# Patient Record
Sex: Male | Born: 1970 | Race: Black or African American | Hispanic: No | Marital: Married | Smoking: Never smoker
Health system: Southern US, Community
[De-identification: ages and names within clinical notes are randomized; demographics above are authoritative.]

## PROBLEM LIST (undated history)

## (undated) DIAGNOSIS — E119 Type 2 diabetes mellitus without complications: Secondary | ICD-10-CM

## (undated) DIAGNOSIS — I10 Essential (primary) hypertension: Secondary | ICD-10-CM

## (undated) DIAGNOSIS — E041 Nontoxic single thyroid nodule: Secondary | ICD-10-CM

## (undated) HISTORY — DX: Essential (primary) hypertension: I10

## (undated) HISTORY — DX: Nontoxic single thyroid nodule: E04.1

## (undated) HISTORY — DX: Type 2 diabetes mellitus without complications: E11.9

## (undated) HISTORY — PX: VASECTOMY: SHX75

---

## 2002-09-26 ENCOUNTER — Ambulatory Visit (HOSPITAL_BASED_OUTPATIENT_CLINIC_OR_DEPARTMENT_OTHER): Admission: RE | Admit: 2002-09-26 | Discharge: 2002-09-26 | Payer: Self-pay | Admitting: Ophthalmology

## 2002-09-26 ENCOUNTER — Encounter (INDEPENDENT_AMBULATORY_CARE_PROVIDER_SITE_OTHER): Payer: Self-pay | Admitting: *Deleted

## 2004-05-04 ENCOUNTER — Ambulatory Visit (HOSPITAL_COMMUNITY): Admission: RE | Admit: 2004-05-04 | Discharge: 2004-05-04 | Payer: Self-pay | Admitting: Gastroenterology

## 2004-11-23 ENCOUNTER — Ambulatory Visit: Payer: Self-pay | Admitting: Family Medicine

## 2006-10-18 ENCOUNTER — Ambulatory Visit: Payer: Self-pay | Admitting: Family Medicine

## 2007-09-21 ENCOUNTER — Ambulatory Visit: Payer: Self-pay | Admitting: Family Medicine

## 2007-09-22 LAB — CONVERTED CEMR LAB
ALT: 28 units/L (ref 0–53)
AST: 38 units/L — ABNORMAL HIGH (ref 0–37)
Albumin: 4.1 g/dL (ref 3.5–5.2)
Alkaline Phosphatase: 69 units/L (ref 39–117)
BUN: 13 mg/dL (ref 6–23)
Basophils Absolute: 0 10*3/uL (ref 0.0–0.1)
Basophils Relative: 0.3 % (ref 0.0–1.0)
Bilirubin, Direct: 0.2 mg/dL (ref 0.0–0.3)
CO2: 30 meq/L (ref 19–32)
Calcium: 9.8 mg/dL (ref 8.4–10.5)
Chloride: 102 meq/L (ref 96–112)
Cholesterol: 179 mg/dL (ref 0–200)
Creatinine, Ser: 1.2 mg/dL (ref 0.4–1.5)
Eosinophils Absolute: 0.2 10*3/uL (ref 0.0–0.6)
Eosinophils Relative: 2.5 % (ref 0.0–5.0)
GFR calc Af Amer: 87 mL/min
GFR calc non Af Amer: 72 mL/min
Glucose, Bld: 109 mg/dL — ABNORMAL HIGH (ref 70–99)
HCT: 42.4 % (ref 39.0–52.0)
HDL: 34.9 mg/dL — ABNORMAL LOW (ref >39.0)
Hemoglobin: 14.9 g/dL (ref 13.0–17.0)
LDL Cholesterol: 128 mg/dL — ABNORMAL HIGH (ref 0–99)
Lymphocytes Relative: 20.8 % (ref 12.0–46.0)
MCHC: 35.1 g/dL (ref 30.0–36.0)
MCV: 88.8 fL (ref 78.0–100.0)
Monocytes Absolute: 0.5 10*3/uL (ref 0.2–0.7)
Monocytes Relative: 6.4 % (ref 3.0–11.0)
Neutro Abs: 5.9 10*3/uL (ref 1.4–7.7)
Neutrophils Relative %: 70 % (ref 43.0–77.0)
Platelets: 315 10*3/uL (ref 150–400)
Potassium: 4.3 meq/L (ref 3.5–5.1)
RBC: 4.77 M/uL (ref 4.22–5.81)
RDW: 12.1 % (ref 11.5–14.6)
Sodium: 139 meq/L (ref 135–145)
TSH: 1.41 microintl units/mL (ref 0.35–5.50)
Total Bilirubin: 1.1 mg/dL (ref 0.3–1.2)
Total CHOL/HDL Ratio: 5.1
Total Protein: 7.3 g/dL (ref 6.0–8.3)
Triglycerides: 80 mg/dL (ref 0–149)
VLDL: 16 mg/dL (ref 0–40)
WBC: 8.3 10*3/uL (ref 4.5–10.5)

## 2007-10-16 ENCOUNTER — Ambulatory Visit: Payer: Self-pay | Admitting: Family Medicine

## 2007-10-16 DIAGNOSIS — Z9189 Other specified personal risk factors, not elsewhere classified: Secondary | ICD-10-CM | POA: Insufficient documentation

## 2007-10-16 DIAGNOSIS — I10 Essential (primary) hypertension: Secondary | ICD-10-CM | POA: Insufficient documentation

## 2007-12-26 ENCOUNTER — Telehealth (INDEPENDENT_AMBULATORY_CARE_PROVIDER_SITE_OTHER): Payer: Self-pay | Admitting: *Deleted

## 2007-12-26 ENCOUNTER — Encounter: Payer: Self-pay | Admitting: Family Medicine

## 2008-01-23 ENCOUNTER — Encounter: Payer: Self-pay | Admitting: Family Medicine

## 2008-04-29 ENCOUNTER — Telehealth: Payer: Self-pay | Admitting: Family Medicine

## 2008-04-29 ENCOUNTER — Encounter: Payer: Self-pay | Admitting: Family Medicine

## 2008-05-01 ENCOUNTER — Ambulatory Visit: Payer: Self-pay | Admitting: Family Medicine

## 2008-07-18 ENCOUNTER — Encounter: Payer: Self-pay | Admitting: Family Medicine

## 2008-08-05 ENCOUNTER — Encounter: Payer: Self-pay | Admitting: Family Medicine

## 2008-08-05 HISTORY — PX: COLONOSCOPY: SHX174

## 2008-08-26 ENCOUNTER — Ambulatory Visit: Payer: Self-pay | Admitting: Family Medicine

## 2008-09-02 ENCOUNTER — Encounter: Payer: Self-pay | Admitting: Family Medicine

## 2008-09-23 ENCOUNTER — Ambulatory Visit: Payer: Self-pay | Admitting: Family Medicine

## 2008-09-23 LAB — CONVERTED CEMR LAB
Bilirubin Urine: NEGATIVE
Blood in Urine, dipstick: NEGATIVE
Glucose, Urine, Semiquant: NEGATIVE
Ketones, urine, test strip: NEGATIVE
Nitrite: NEGATIVE
Protein, U semiquant: NEGATIVE
Specific Gravity, Urine: 1.02
Urobilinogen, UA: 0.2
WBC Urine, dipstick: NEGATIVE
pH: 5.5

## 2008-09-25 LAB — CONVERTED CEMR LAB
ALT: 23 units/L (ref 0–53)
AST: 32 units/L (ref 0–37)
Albumin: 3.8 g/dL (ref 3.5–5.2)
Alkaline Phosphatase: 64 units/L (ref 39–117)
BUN: 13 mg/dL (ref 6–23)
Basophils Absolute: 0 10*3/uL (ref 0.0–0.1)
Basophils Relative: 0.1 % (ref 0.0–3.0)
Bilirubin, Direct: 0.1 mg/dL (ref 0.0–0.3)
CO2: 30 meq/L (ref 19–32)
Calcium: 9.3 mg/dL (ref 8.4–10.5)
Chloride: 110 meq/L (ref 96–112)
Cholesterol: 161 mg/dL (ref 0–200)
Creatinine, Ser: 1 mg/dL (ref 0.4–1.5)
Eosinophils Absolute: 0.3 10*3/uL (ref 0.0–0.7)
Eosinophils Relative: 4.5 % (ref 0.0–5.0)
GFR calc Af Amer: 107 mL/min
GFR calc non Af Amer: 88 mL/min
Glucose, Bld: 110 mg/dL — ABNORMAL HIGH (ref 70–99)
HCT: 42.7 % (ref 39.0–52.0)
HDL: 38.4 mg/dL — ABNORMAL LOW (ref >39.0)
Hemoglobin: 14.6 g/dL (ref 13.0–17.0)
LDL Cholesterol: 107 mg/dL — ABNORMAL HIGH (ref 0–99)
Lymphocytes Relative: 14.5 % (ref 12.0–46.0)
MCHC: 34.2 g/dL (ref 30.0–36.0)
MCV: 89.3 fL (ref 78.0–100.0)
Monocytes Absolute: 0.4 10*3/uL (ref 0.1–1.0)
Monocytes Relative: 6.1 % (ref 3.0–12.0)
Neutro Abs: 4.9 10*3/uL (ref 1.4–7.7)
Neutrophils Relative %: 74.8 % (ref 43.0–77.0)
PSA: 0.24 ng/mL (ref 0.10–4.00)
Platelets: 289 10*3/uL (ref 150–400)
Potassium: 4.5 meq/L (ref 3.5–5.1)
RBC: 4.78 M/uL (ref 4.22–5.81)
RDW: 12.2 % (ref 11.5–14.6)
Sodium: 143 meq/L (ref 135–145)
TSH: 0.79 microintl units/mL (ref 0.35–5.50)
Total Bilirubin: 1.2 mg/dL (ref 0.3–1.2)
Total CHOL/HDL Ratio: 4.2
Total Protein: 7.1 g/dL (ref 6.0–8.3)
Triglycerides: 78 mg/dL (ref 0–149)
VLDL: 16 mg/dL (ref 0–40)
WBC: 6.6 10*3/uL (ref 4.5–10.5)

## 2009-04-14 ENCOUNTER — Ambulatory Visit: Payer: Self-pay | Admitting: Family Medicine

## 2009-04-25 ENCOUNTER — Ambulatory Visit: Payer: Self-pay | Admitting: Family Medicine

## 2009-04-25 DIAGNOSIS — B029 Zoster without complications: Secondary | ICD-10-CM | POA: Insufficient documentation

## 2009-07-17 ENCOUNTER — Ambulatory Visit: Payer: Self-pay | Admitting: Family Medicine

## 2009-07-17 DIAGNOSIS — E049 Nontoxic goiter, unspecified: Secondary | ICD-10-CM | POA: Insufficient documentation

## 2009-07-18 ENCOUNTER — Telehealth: Payer: Self-pay | Admitting: Family Medicine

## 2009-07-18 LAB — CONVERTED CEMR LAB
BUN: 10 mg/dL (ref 6–23)
Basophils Absolute: 0 10*3/uL (ref 0.0–0.1)
Basophils Relative: 0.1 % (ref 0.0–3.0)
CO2: 30 meq/L (ref 19–32)
Calcium: 9.2 mg/dL (ref 8.4–10.5)
Chloride: 98 meq/L (ref 96–112)
Creatinine, Ser: 1 mg/dL (ref 0.4–1.5)
Eosinophils Absolute: 0.1 10*3/uL (ref 0.0–0.7)
Eosinophils Relative: 2 % (ref 0.0–5.0)
Free T4: 0.7 ng/dL (ref 0.6–1.6)
GFR calc non Af Amer: 87.78 mL/min (ref >60)
Glucose, Bld: 109 mg/dL — ABNORMAL HIGH (ref 70–99)
HCT: 42 % (ref 39.0–52.0)
Hemoglobin: 14.4 g/dL (ref 13.0–17.0)
Lymphocytes Relative: 20.1 % (ref 12.0–46.0)
Lymphs Abs: 1.4 10*3/uL (ref 0.7–4.0)
MCHC: 34.3 g/dL (ref 30.0–36.0)
MCV: 88.3 fL (ref 78.0–100.0)
Monocytes Absolute: 0.4 10*3/uL (ref 0.1–1.0)
Monocytes Relative: 5.9 % (ref 3.0–12.0)
Neutro Abs: 5.1 10*3/uL (ref 1.4–7.7)
Neutrophils Relative %: 71.9 % (ref 43.0–77.0)
Platelets: 300 10*3/uL (ref 150.0–400.0)
Potassium: 4 meq/L (ref 3.5–5.1)
RBC: 4.75 M/uL (ref 4.22–5.81)
RDW: 12.1 % (ref 11.5–14.6)
Sodium: 136 meq/L (ref 135–145)
T3, Free: 4 pg/mL (ref 2.3–4.2)
TSH: 1.9 microintl units/mL (ref 0.35–5.50)
WBC: 7 10*3/uL (ref 4.5–10.5)

## 2009-07-22 ENCOUNTER — Encounter: Admission: RE | Admit: 2009-07-22 | Discharge: 2009-07-22 | Payer: Self-pay | Admitting: Family Medicine

## 2009-07-30 ENCOUNTER — Encounter: Admission: RE | Admit: 2009-07-30 | Discharge: 2009-07-30 | Payer: Self-pay | Admitting: Family Medicine

## 2009-07-30 ENCOUNTER — Encounter (INDEPENDENT_AMBULATORY_CARE_PROVIDER_SITE_OTHER): Payer: Self-pay | Admitting: Interventional Radiology

## 2009-07-30 ENCOUNTER — Other Ambulatory Visit: Admission: RE | Admit: 2009-07-30 | Discharge: 2009-07-30 | Payer: Self-pay | Admitting: Interventional Radiology

## 2009-07-30 ENCOUNTER — Encounter (INDEPENDENT_AMBULATORY_CARE_PROVIDER_SITE_OTHER): Payer: Self-pay | Admitting: *Deleted

## 2009-08-08 ENCOUNTER — Telehealth (INDEPENDENT_AMBULATORY_CARE_PROVIDER_SITE_OTHER): Payer: Self-pay | Admitting: *Deleted

## 2009-10-13 ENCOUNTER — Ambulatory Visit: Payer: Self-pay | Admitting: Family Medicine

## 2009-10-15 LAB — CONVERTED CEMR LAB
ALT: 28 units/L (ref 0–53)
AST: 38 units/L — ABNORMAL HIGH (ref 0–37)
Albumin: 4.3 g/dL (ref 3.5–5.2)
Alkaline Phosphatase: 71 units/L (ref 39–117)
BUN: 9 mg/dL (ref 6–23)
Basophils Absolute: 0 10*3/uL (ref 0.0–0.1)
Basophils Relative: 0.6 % (ref 0.0–3.0)
Bilirubin, Direct: 0.1 mg/dL (ref 0.0–0.3)
CO2: 31 meq/L (ref 19–32)
Calcium: 9.8 mg/dL (ref 8.4–10.5)
Chloride: 100 meq/L (ref 96–112)
Cholesterol: 202 mg/dL — ABNORMAL HIGH (ref 0–200)
Creatinine, Ser: 1.2 mg/dL (ref 0.4–1.5)
Direct LDL: 134.2 mg/dL
Eosinophils Absolute: 0.1 10*3/uL (ref 0.0–0.7)
Eosinophils Relative: 1.4 % (ref 0.0–5.0)
GFR calc non Af Amer: 85.96 mL/min (ref >60)
Glucose, Bld: 116 mg/dL — ABNORMAL HIGH (ref 70–99)
HCT: 46.3 % (ref 39.0–52.0)
HDL: 42.5 mg/dL (ref >39.00)
Hemoglobin: 15.4 g/dL (ref 13.0–17.0)
Lymphocytes Relative: 21 % (ref 12.0–46.0)
Lymphs Abs: 1.5 10*3/uL (ref 0.7–4.0)
MCHC: 33.3 g/dL (ref 30.0–36.0)
MCV: 91.5 fL (ref 78.0–100.0)
Monocytes Absolute: 0.5 10*3/uL (ref 0.1–1.0)
Monocytes Relative: 6.4 % (ref 3.0–12.0)
Neutro Abs: 5 10*3/uL (ref 1.4–7.7)
Neutrophils Relative %: 70.6 % (ref 43.0–77.0)
PSA: 0.29 ng/mL (ref 0.10–4.00)
Platelets: 328 10*3/uL (ref 150.0–400.0)
Potassium: 4.2 meq/L (ref 3.5–5.1)
RBC: 5.06 M/uL (ref 4.22–5.81)
RDW: 12.1 % (ref 11.5–14.6)
Sodium: 140 meq/L (ref 135–145)
TSH: 1.12 microintl units/mL (ref 0.35–5.50)
Total Bilirubin: 1.2 mg/dL (ref 0.3–1.2)
Total CHOL/HDL Ratio: 5
Total Protein: 8.1 g/dL (ref 6.0–8.3)
Triglycerides: 109 mg/dL (ref 0.0–149.0)
VLDL: 21.8 mg/dL (ref 0.0–40.0)
WBC: 7.1 10*3/uL (ref 4.5–10.5)

## 2009-12-01 ENCOUNTER — Encounter: Admission: RE | Admit: 2009-12-01 | Discharge: 2009-12-01 | Payer: Self-pay | Admitting: Family Medicine

## 2009-12-15 ENCOUNTER — Encounter: Payer: Self-pay | Admitting: Family Medicine

## 2010-05-26 ENCOUNTER — Telehealth: Payer: Self-pay | Admitting: Family Medicine

## 2010-10-20 ENCOUNTER — Ambulatory Visit: Payer: Self-pay | Admitting: Family Medicine

## 2010-10-20 LAB — CONVERTED CEMR LAB
Bilirubin Urine: NEGATIVE
Blood in Urine, dipstick: NEGATIVE
Glucose, Urine, Semiquant: NEGATIVE
Ketones, urine, test strip: NEGATIVE
Nitrite: NEGATIVE
Protein, U semiquant: NEGATIVE
Specific Gravity, Urine: 1.01
Urobilinogen, UA: 0.2
WBC Urine, dipstick: NEGATIVE
pH: 6

## 2010-10-22 LAB — CONVERTED CEMR LAB
ALT: 21 units/L (ref 0–53)
AST: 38 units/L — ABNORMAL HIGH (ref 0–37)
Albumin: 4.3 g/dL (ref 3.5–5.2)
Alkaline Phosphatase: 68 units/L (ref 39–117)
BUN: 20 mg/dL (ref 6–23)
Basophils Absolute: 0 10*3/uL (ref 0.0–0.1)
Basophils Relative: 0.5 % (ref 0.0–3.0)
Bilirubin, Direct: 0.2 mg/dL (ref 0.0–0.3)
CO2: 30 meq/L (ref 19–32)
Calcium: 9.7 mg/dL (ref 8.4–10.5)
Chloride: 99 meq/L (ref 96–112)
Cholesterol: 195 mg/dL (ref 0–200)
Creatinine, Ser: 1.2 mg/dL (ref 0.4–1.5)
Eosinophils Absolute: 0.1 10*3/uL (ref 0.0–0.7)
Eosinophils Relative: 1.2 % (ref 0.0–5.0)
GFR calc non Af Amer: 83.91 mL/min (ref >60.00)
Glucose, Bld: 100 mg/dL — ABNORMAL HIGH (ref 70–99)
HCT: 43.6 % (ref 39.0–52.0)
HDL: 44.3 mg/dL (ref >39.00)
Hemoglobin: 15.1 g/dL (ref 13.0–17.0)
LDL Cholesterol: 134 mg/dL — ABNORMAL HIGH (ref 0–99)
Lymphocytes Relative: 16.4 % (ref 12.0–46.0)
Lymphs Abs: 1.7 10*3/uL (ref 0.7–4.0)
MCHC: 34.6 g/dL (ref 30.0–36.0)
MCV: 89.9 fL (ref 78.0–100.0)
Monocytes Absolute: 0.5 10*3/uL (ref 0.1–1.0)
Monocytes Relative: 5 % (ref 3.0–12.0)
Neutro Abs: 8 10*3/uL — ABNORMAL HIGH (ref 1.4–7.7)
Neutrophils Relative %: 76.9 % (ref 43.0–77.0)
PSA: 0.29 ng/mL (ref 0.10–4.00)
Platelets: 342 10*3/uL (ref 150.0–400.0)
Potassium: 4.1 meq/L (ref 3.5–5.1)
RBC: 4.84 M/uL (ref 4.22–5.81)
RDW: 12.6 % (ref 11.5–14.6)
Sodium: 138 meq/L (ref 135–145)
TSH: 1.97 microintl units/mL (ref 0.35–5.50)
Total Bilirubin: 1.4 mg/dL — ABNORMAL HIGH (ref 0.3–1.2)
Total CHOL/HDL Ratio: 4
Total Protein: 7.8 g/dL (ref 6.0–8.3)
Triglycerides: 85 mg/dL (ref 0.0–149.0)
VLDL: 17 mg/dL (ref 0.0–40.0)
WBC: 10.4 10*3/uL (ref 4.5–10.5)

## 2010-10-26 ENCOUNTER — Encounter
Admission: RE | Admit: 2010-10-26 | Discharge: 2010-10-26 | Payer: Self-pay | Source: Home / Self Care | Attending: Family Medicine | Admitting: Family Medicine

## 2010-12-08 NOTE — Progress Notes (Signed)
Summary: EXPLANATION OF NO SHOW   Phone Note Call from Patient Call back at Home Phone [phone]   Caller: PT LIVE Call For: FRY  Summary of Call: PATIENT CALLED STATES HE HAD AN EMERGENCY AND HAD TO GO OUT OF TOWN.  HE JUST RETURNED TO [place].   Initial call taken by: Roselle Locus,  April 29, 2008 3:13 PM  Follow-up for Phone Call        ok, reschedule his visit Follow-up by: Nelwyn Salisbury MD,  April 29, 2008 5:26 PM

## 2010-12-08 NOTE — Progress Notes (Signed)
  Phone Note Call from Patient   Caller: Patient Summary of Call: here with his son asking about when to look at his thyroid again. We wanted to do a 6 month follow up, and the last one was in January.  Initial call taken by: Nelwyn Salisbury MD,  May 26, 2010 4:33 PM  Follow-up for Phone Call        set up another thyroid US soon to follow a nodule (was seen at [facility])  Follow-up by: Nelwyn Salisbury MD,  May 26, 2010 4:33 PM

## 2010-12-08 NOTE — Letter (Signed)
Summary: Generic Letter  [place] [facility]  [address] [place], [place] [address]   Phone: [phone]  Fax: [phone]    12/15/2009  Craig Nguyen [address] [place], [place]  [address]  Dear [name],  I have been trying to reach you by phone to discuss your ultrasound results.  Your ultrasound shows the large nodule on the right side of the thyroid to be unchanged from before, so no intervention is needed. We suggest a follow up thyroid US again in 6 months.  Please call Arline Asp if you have any questions at [phone] ext [phone].  Thanks for your time.  Sincerely,   Gershon Crane, MD

## 2010-12-08 NOTE — Assessment & Plan Note (Signed)
Summary: error      Current Allergies: ! ASA        Complete Medication List: 1)  Diovan Hct 160-25 Mg Tabs (Valsartan-hydrochlorothiazide) .Marland Kitchen.. 1 by mouth once daily    ]

## 2010-12-08 NOTE — Assessment & Plan Note (Signed)
Summary: F UP FOR MEDS //DB   Vital Signs:  Patient Profile:   40 Years Old Male Height:     71.5 inches Weight:      225 pounds Temp:     98.4 degrees F oral Pulse rate:   84 / minute BP sitting:   130 / 82  (left arm) Cuff size:   large  Vitals Entered By: Alfred Levins, CMA (August 26, 2008 8:48 AM)                hh  Chief Complaint:  bp check and renew med.  History of Present Illness: Here to recheck BP. Feels good, exercising.    Current Allergies: ! ASA  Past Medical History:    Reviewed history from 10/16/2007 and no changes required:       Hypertension  Past Surgical History:    Reviewed history from 10/16/2007 and no changes required:       colonoscopy 08-05-08 per Dr. Loreta Ave, repeat at age 44       Vasectomy     Review of Systems  The patient denies anorexia, fever, weight loss, weight gain, vision loss, decreased hearing, hoarseness, chest pain, syncope, dyspnea on exertion, peripheral edema, prolonged cough, headaches, hemoptysis, abdominal pain, melena, hematochezia, severe indigestion/heartburn, hematuria, incontinence, genital sores, muscle weakness, suspicious skin lesions, transient blindness, difficulty walking, depression, unusual weight change, abnormal bleeding, enlarged lymph nodes, angioedema, breast masses, and testicular masses.     Physical Exam  General:     Well-developed,well-nourished,in no acute distress; alert,appropriate and cooperative throughout examination Neck:     No deformities, masses, or tenderness noted. Lungs:     Normal respiratory effort, chest expands symmetrically. Lungs are clear to auscultation, no crackles or wheezes. Heart:     Normal rate and regular rhythm. S1 and S2 normal without gallop, murmur, click, rub or other extra sounds.    Impression & Recommendations:  Problem # 1:  HYPERTENSION (ICD-401.9)  His updated medication list for this problem includes:    Diovan Hct 160-25 Mg Tabs  (Valsartan-hydrochlorothiazide) .Marland Kitchen... 1 by mouth once daily  Flu Vaccine Consent Questions     Do you have a history of severe allergic reactions to this vaccine? no    Any prior history of allergic reactions to egg and/or gelatin? no    Do you have a sensitivity to the preservative Thimersol? no    Do you have a past history of Guillan-Barre Syndrome? no    Do you currently have an acute febrile illness? no    Have you ever had a severe reaction to latex? no    Vaccine information given and explained to patient? yes    Are you currently pregnant? no    Lot Number:[id number]   Site Given  Left Deltoid IM  Complete Medication List: 1)  Diovan Hct 160-25 Mg Tabs (Valsartan-hydrochlorothiazide) .Marland Kitchen.. 1 by mouth once daily  Other Orders: Admin 1st Vaccine ([address]) Flu Vaccine 26yrs + ([address])   Patient Instructions: 1)  Please schedule a follow-up appointment in 6 months.   Prescriptions: DIOVAN HCT 160-25 MG  TABS (VALSARTAN-HYDROCHLOROTHIAZIDE) 1 by mouth once daily  #30 x 11   Entered and Authorized by:   Nelwyn Salisbury MD   Signed by:   Nelwyn Salisbury MD on 08/26/2008   Method used:   [facility]  [address] [id number]       [address]  [place], [place]  [address]       Ph: [phone] or [phone]       Fax: [phone]   RxID:   [id number]  ]

## 2010-12-08 NOTE — Consult Note (Signed)
Summary: Dr Loreta Ave note  Dr Loreta Ave note   Imported By: Kassie Mends 07/31/2008 13:47:51  _____________________________________________________________________  External Attachment:    Type:   Image     Comment:   Dr Loreta Ave note

## 2010-12-08 NOTE — Consult Note (Signed)
Summary: Dr Loreta Ave note  Dr Loreta Ave note   Imported By: Kassie Mends 01/01/2008 08:24:07  _____________________________________________________________________  External Attachment:    Type:   Image     Comment:   Dr Loreta Ave note

## 2010-12-08 NOTE — Progress Notes (Signed)
Summary: requesting results  Phone Note Call from Patient   Summary of Call: Patient requesting results of thyroid biopsy.  Patient can be reached at [phone]. Initial call taken by: Darra Lis RMA,  August 08, 2009 9:58 AM  Follow-up for Phone Call        no results yet Follow-up by: Nelwyn Salisbury MD,  August 08, 2009 11:07 AM  Additional Follow-up for Phone Call Additional follow up Details #1::        Patient aware-Patient will check back with [place] in a couple of days. Additional Follow-up by: Darra Lis RMA,  August 08, 2009 11:21 AM

## 2010-12-08 NOTE — Assessment & Plan Note (Signed)
Summary: FOLLOW UP/MHF   Vital Signs:  Patient Profile:   40 Years Old Male Height:     71.5 inches Weight:      227 pounds Temp:     98.1 degrees F oral Pulse rate:   94 / minute Pulse rhythm:   regular BP sitting:   110 / 82  (left arm) Cuff size:   large  Vitals Entered By: Alfred Levins, CMA (May 01, 2008 8:52 AM)                 Chief Complaint:  f/u.  History of Present Illness: Here to recheck BP. He feels great, is exercising, has lost a little weight.     Current Allergies: ! ASA  Past Medical History:    Reviewed history from 10/16/2007 and no changes required:       Hypertension     Review of Systems  The patient denies anorexia, fever, weight loss, weight gain, vision loss, decreased hearing, hoarseness, chest pain, syncope, dyspnea on exertion, peripheral edema, prolonged cough, headaches, hemoptysis, abdominal pain, melena, hematochezia, severe indigestion/heartburn, hematuria, incontinence, genital sores, muscle weakness, suspicious skin lesions, transient blindness, difficulty walking, depression, unusual weight change, abnormal bleeding, enlarged lymph nodes, angioedema, breast masses, and testicular masses.     Physical Exam  General:     Well-developed,well-nourished,in no acute distress; alert,appropriate and cooperative throughout examination Lungs:     Normal respiratory effort, chest expands symmetrically. Lungs are clear to auscultation, no crackles or wheezes. Heart:     Normal rate and regular rhythm. S1 and S2 normal without gallop, murmur, click, rub or other extra sounds.    Impression & Recommendations:  Problem # 1:  HYPERTENSION (ICD-401.9)  His updated medication list for this problem includes:    Diovan Hct 160-25 Mg Tabs (Valsartan-hydrochlorothiazide) .Marland Kitchen... 1 by mouth once daily   Complete Medication List: 1)  Diovan Hct 160-25 Mg Tabs (Valsartan-hydrochlorothiazide) .Marland Kitchen.. 1 by mouth once daily   Patient  Instructions: 1)  Please schedule a follow-up appointment in 6 months.   ]

## 2010-12-08 NOTE — Consult Note (Signed)
Summary: Dr Loreta Ave note  Dr Loreta Ave note   Imported By: Kassie Mends 02/21/2008 16:22:47  _____________________________________________________________________  External Attachment:    Type:   Image     Comment:   Dr Loreta Ave note

## 2010-12-08 NOTE — Procedures (Signed)
Summary: Colonoscopy Report/[facility]  Colonoscopy Report/[facility]   Imported By: Maryln Gottron 08/08/2008 14:57:45  _____________________________________________________________________  External Attachment:    Type:   Image     Comment:   External Document

## 2010-12-08 NOTE — Assessment & Plan Note (Signed)
Summary: Left arm pain/rash/LC   Vital Signs:  Patient profile:   40 year old male Weight:      229 pounds Temp:     97.8 degrees F oral Pulse rate:   82 / minute BP sitting:   122 / 84  (left arm) Cuff size:   large  Vitals Entered By: Alfred Levins, CMA (April 25, 2009 10:29 AM) CC: lt arm pain with rash   History of Present Illness: Here with burning pains in the left shoulder and left upper arm for 3 days, then onset last night of red bumps in these areas.   Allergies: 1)  ! Jonne Ply  Past History:  Past Medical History: Reviewed history from 10/16/2007 and no changes required. Hypertension  Review of Systems  The patient denies anorexia, fever, weight loss, weight gain, vision loss, decreased hearing, hoarseness, chest pain, syncope, dyspnea on exertion, peripheral edema, prolonged cough, headaches, hemoptysis, abdominal pain, melena, hematochezia, severe indigestion/heartburn, hematuria, incontinence, genital sores, muscle weakness, suspicious skin lesions, transient blindness, difficulty walking, depression, unusual weight change, abnormal bleeding, enlarged lymph nodes, angioedema, breast masses, and testicular masses.    Physical Exam  General:  Well-developed,well-nourished,in no acute distress; alert,appropriate and cooperative throughout examination Neurologic:  No cranial nerve deficits noted. Station and gait are normal. Plantar reflexes are down-going bilaterally. DTRs are symmetrical throughout. Sensory, motor and coordinative functions appear intact. Skin:  scattered red vessicles over left upper arm, left chest and shoulder, and lefgt upper back   Impression & Recommendations:  Problem # 1:  HERPES ZOSTER (ICD-053.9)  Complete Medication List: 1)  Diovan Hct 160-25 Mg Tabs (Valsartan-hydrochlorothiazide) .Marland Kitchen.. 1 by mouth once daily 2)  Prednisone (pak) 10 Mg Tabs (Prednisone) .... As directed for 12 days 3)  Valtrex 1 Gm Tabs (Valacyclovir hcl) .... Three times  a day  Patient Instructions: 1)  Please schedule a follow-up appointment as needed .  Prescriptions: VALTREX 1 GM TABS (VALACYCLOVIR HCL) three times a day  #21 x 0   Entered and Authorized by:   Nelwyn Salisbury MD   Signed by:   Nelwyn Salisbury MD on 04/25/2009   Method used:   [facility]  [address] [id number]       [address]       [place], [place]  [address]       Ph: [phone] or [phone]       Fax: [phone]   RxID:   [id number] PREDNISONE (PAK) 10 MG TABS (PREDNISONE) as directed for 12 days  #1 x 0   Entered and Authorized by:   Nelwyn Salisbury MD   Signed by:   Nelwyn Salisbury MD on 04/25/2009   Method used:   [facility]        [facility]  [address] [id number]       [address]       [place], [place]  [address]       Ph: [phone] or [phone]       Fax: [phone]   RxID:   [id number]

## 2010-12-08 NOTE — Assessment & Plan Note (Signed)
Summary: 6 MONTH ROV/NJR/PT RESCD//CCM   Vital Signs:  Patient profile:   40 year old male Weight:      232 pounds BMI:     32.02 Temp:     97.5 degrees F oral Pulse rate:   88 / minute BP sitting:   114 / 72  (left arm) Cuff size:   large  Vitals Entered By: Alfred Levins, CMA (April 14, 2009 8:56 AM) CC: 6 mth f/u, fasting   History of Present Illness: Feels great. watches his diet and exercises several days a week.   Allergies: 1)  ! Jonne Ply  Past History:  Past Medical History: Reviewed history from 10/16/2007 and no changes required. Hypertension  Review of Systems  The patient denies anorexia, fever, weight loss, weight gain, vision loss, decreased hearing, hoarseness, chest pain, syncope, dyspnea on exertion, peripheral edema, prolonged cough, headaches, hemoptysis, abdominal pain, melena, hematochezia, severe indigestion/heartburn, hematuria, incontinence, genital sores, muscle weakness, suspicious skin lesions, transient blindness, difficulty walking, depression, unusual weight change, abnormal bleeding, enlarged lymph nodes, angioedema, breast masses, and testicular masses.    Physical Exam  General:  Well-developed,well-nourished,in no acute distress; alert,appropriate and cooperative throughout examination Neck:  No deformities, masses, or tenderness noted. Lungs:  Normal respiratory effort, chest expands symmetrically. Lungs are clear to auscultation, no crackles or wheezes. Heart:  Normal rate and regular rhythm. S1 and S2 normal without gallop, murmur, click, rub or other extra sounds. Extremities:  no edema   Impression & Recommendations:  Problem # 1:  HYPERTENSION (ICD-401.9)  His updated medication list for this problem includes:    Diovan Hct 160-25 Mg Tabs (Valsartan-hydrochlorothiazide) .Marland Kitchen... 1 by mouth once daily  Complete Medication List: 1)  Diovan Hct 160-25 Mg Tabs (Valsartan-hydrochlorothiazide) .Marland Kitchen.. 1 by mouth once daily  Patient  Instructions: 1)  recheck with cpx and labs in 6 months

## 2010-12-08 NOTE — Letter (Signed)
Summary: [facility]   Imported By: Maryln Gottron 09/18/2008 14:19:30  _____________________________________________________________________  External Attachment:    Type:   Image     Comment:   External Document

## 2010-12-08 NOTE — Assessment & Plan Note (Signed)
Summary: CPX/MHF   Vital Signs:  Patient Profile:   40 Years Old Male Height:     71.5 inches Weight:      228 pounds Temp:     98.6 degrees F oral Pulse rate:   76 / minute Pulse rhythm:   regular BP sitting:   128 / 82  (left arm) Cuff size:   large  Vitals Entered By: Alfred Levins, CMA (October 16, 2007 10:23 AM)                 Chief Complaint:  cpx.  History of Present Illness: 40 yr old male for cpx. Feels fine. BP is stable.  Current Allergies: ! ASA  Past Medical History:    Reviewed history and no changes required:       Hypertension  Past Surgical History:    Reviewed history and no changes required:       colonoscopy 2005, hemmorrhoids only       Vasectomy   Family History:    Reviewed history and no changes required:       Family History Diabetes 1st degree relative       Family History Hypertension       Family History of Prostate CA 1st degree relative <50  Social History:    Reviewed history and no changes required:       Married       Never Smoked       Alcohol use-no       Drug use-no   Risk Factors:  Tobacco use:  never Drug use:  no Alcohol use:  no   Review of Systems  The patient denies anorexia, fever, weight loss, vision loss, decreased hearing, hoarseness, chest pain, syncope, dyspnea on exhertion, peripheral edema, prolonged cough, hemoptysis, abdominal pain, melena, hematochezia, severe indigestion/heartburn, hematuria, incontinence, genital sores, muscle weakness, suspicious skin lesions, transient blindness, difficulty walking, depression, unusual weight change, abnormal bleeding, enlarged lymph nodes, angioedema, breast masses, and testicular masses.     Physical Exam  General:     Well-developed,well-nourished,in no acute distress; alert,appropriate and cooperative throughout examination Head:     Normocephalic and atraumatic without obvious abnormalities. No apparent alopecia or balding. Eyes:     No corneal or  conjunctival inflammation noted. EOMI. Perrla. Funduscopic exam benign, without hemorrhages, exudates or papilledema. Vision grossly normal. Ears:     External ear exam shows no significant lesions or deformities.  Otoscopic examination reveals clear canals, tympanic membranes are intact bilaterally without bulging, retraction, inflammation or discharge. Hearing is grossly normal bilaterally. Nose:     External nasal examination shows no deformity or inflammation. Nasal mucosa are pink and moist without lesions or exudates. Mouth:     Oral mucosa and oropharynx without lesions or exudates.  Teeth in good repair. Neck:     No deformities, masses, or tenderness noted. Chest Wall:     No deformities, masses, tenderness or gynecomastia noted. Lungs:     Normal respiratory effort, chest expands symmetrically. Lungs are clear to auscultation, no crackles or wheezes. Heart:     Normal rate and regular rhythm. S1 and S2 normal without gallop, murmur, click, rub or other extra sounds. Abdomen:     Bowel sounds positive,abdomen soft and non-tender without masses, organomegaly or hernias noted. Genitalia:     Testes bilaterally descended without nodularity, tenderness or masses. No scrotal masses or lesions. No penis lesions or urethral discharge. Msk:     No deformity or scoliosis noted of  thoracic or lumbar spine.   Pulses:     R and L carotid,radial,femoral,dorsalis pedis and posterior tibial pulses are full and equal bilaterally Extremities:     No clubbing, cyanosis, edema, or deformity noted with normal full range of motion of all joints.   Neurologic:     No cranial nerve deficits noted. Station and gait are normal. Plantar reflexes are down-going bilaterally. DTRs are symmetrical throughout. Sensory, motor and coordinative functions appear intact. Skin:     Intact without suspicious lesions or rashes Cervical Nodes:     No lymphadenopathy noted Axillary Nodes:     No palpable  lymphadenopathy Inguinal Nodes:     No significant adenopathy Psych:     Cognition and judgment appear intact. Alert and cooperative with normal attention span and concentration. No apparent delusions, illusions, hallucinations    Impression & Recommendations:  Problem # 1:  WELL ADULT EXAM (ICD-V70.0)  lot [id number], EXP 30 jun 09, sanofi pasteur left deltoid IM, 0.5 cc.  Complete Medication List: 1)  Diovan Hct 160-25 Mg Tabs (Valsartan-hydrochlorothiazide) .Marland Kitchen.. 1 by mouth once daily  Other Orders: Influenza Vaccine NON MCR ([address])   Patient Instructions: 1)  Please schedule a follow-up appointment in 6 months.    Prescriptions: DIOVAN HCT 160-25 MG  TABS (VALSARTAN-HYDROCHLOROTHIAZIDE) 1 by mouth once daily  #30 x 11   Entered and Authorized by:   Nelwyn Salisbury MD   Signed by:   Nelwyn Salisbury MD on 10/16/2007   Method used:   [facility] ...       [facility]  [address] [id number]       [address]       [place], [place]  [address]       Ph: [phone] or [phone]       Fax: [phone]   RxID:   [id number]  ]  Influenza Vaccine    Vaccine Type: Fluvax Non-MCR    Given by: Alfred Levins, CMA  Flu Vaccine Consent Questions    Do you have a history of severe allergic reactions to this vaccine? no    Any prior history of allergic reactions to egg and/or gelatin? no    Do you have a sensitivity to the preservative Thimersol? no    Do you have a past history of Guillan-Barre Syndrome? no    Do you currently have an acute febrile illness? no    Have you ever had a severe reaction to latex? no    Vaccine information given and explained to patient? yes

## 2010-12-10 NOTE — Assessment & Plan Note (Signed)
Summary: CPX/PT COMING IN FASTING/CJR rsc bmp/njr   Vital Signs:  Patient profile:   40 year old male Height:      72 inches Weight:      228 pounds BMI:     31.03 Temp:     98.4 degrees F oral Pulse rate:   62 / minute Pulse rhythm:   regular Resp:     12 per minute BP sitting:   110 / 70  Vitals Entered By: Lynann Beaver CMA AAMA (October 20, 2010 8:47 AM) CC: cpx Is Patient Diabetic? No Pain Assessment Patient in pain? no        CC:  cpx.  History of Present Illness: 40 yr old male for a cpx. He feels fine and has no complaints. He jogs 4 days a week.   Current Medications (verified): 1)  Diovan Hct 160-25 Mg  Tabs (Valsartan-Hydrochlorothiazide) .Marland Kitchen.. 1 By Mouth Once Daily  Allergies (verified): 1)  ! Asa  Past History:  Past Medical History: Hypertension right thyroid lobe nodule, benign  Past Surgical History: Reviewed history from 10/13/2009 and no changes required. colonoscopy 08-05-08 per Dr. Loreta Ave, repeat at age 31 Vasectomy fine needle biopsy of right thyroid lobe nodule 07-30-09  Family History: Reviewed history from 10/16/2007 and no changes required. Family History Diabetes 1st degree relative Family History Hypertension Family History of Prostate CA 1st degree relative <50 sister died at 81 of pulmonary sarcoidosis  Social History: Reviewed history from 10/16/2007 and no changes required. Married Never Smoked Alcohol use-no Drug use-no  Review of Systems  The patient denies anorexia, fever, weight loss, weight gain, vision loss, decreased hearing, hoarseness, chest pain, syncope, dyspnea on exertion, peripheral edema, prolonged cough, headaches, hemoptysis, abdominal pain, melena, hematochezia, severe indigestion/heartburn, hematuria, incontinence, genital sores, muscle weakness, suspicious skin lesions, transient blindness, difficulty walking, depression, unusual weight change, abnormal bleeding, enlarged lymph nodes, angioedema, breast  masses, and testicular masses.    Physical Exam  General:  Well-developed,well-nourished,in no acute distress; alert,appropriate and cooperative throughout examination Head:  Normocephalic and atraumatic without obvious abnormalities. No apparent alopecia or balding. Eyes:  No corneal or conjunctival inflammation noted. EOMI. Perrla. Funduscopic exam benign, without hemorrhages, exudates or papilledema. Vision grossly normal. Ears:  External ear exam shows no significant lesions or deformities.  Otoscopic examination reveals clear canals, tympanic membranes are intact bilaterally without bulging, retraction, inflammation or discharge. Hearing is grossly normal bilaterally. Nose:  External nasal examination shows no deformity or inflammation. Nasal mucosa are pink and moist without lesions or exudates. Mouth:  Oral mucosa and oropharynx without lesions or exudates.  Teeth in good repair. Neck:  the right lobe of the thyroid is enlarged but smooth, not tender  Chest Wall:  No deformities, masses, tenderness or gynecomastia noted. Lungs:  Normal respiratory effort, chest expands symmetrically. Lungs are clear to auscultation, no crackles or wheezes. Heart:  Normal rate and regular rhythm. S1 and S2 normal without gallop, murmur, click, rub or other extra sounds. Abdomen:  Bowel sounds positive,abdomen soft and non-tender without masses, organomegaly or hernias noted. Rectal:  No external abnormalities noted. Normal sphincter tone. No rectal masses or tenderness. Genitalia:  Testes bilaterally descended without nodularity, tenderness or masses. No scrotal masses or lesions. No penis lesions or urethral discharge. Prostate:  Prostate gland firm and smooth, no enlargement, nodularity, tenderness, mass, asymmetry or induration. Msk:  No deformity or scoliosis noted of thoracic or lumbar spine.   Pulses:  R and L carotid,radial,femoral,dorsalis pedis and posterior tibial pulses are  full and equal  bilaterally Extremities:  No clubbing, cyanosis, edema, or deformity noted with normal full range of motion of all joints.   Neurologic:  No cranial nerve deficits noted. Station and gait are normal. Plantar reflexes are down-going bilaterally. DTRs are symmetrical throughout. Sensory, motor and coordinative functions appear intact. Skin:  Intact without suspicious lesions or rashes Cervical Nodes:  No lymphadenopathy noted Axillary Nodes:  No palpable lymphadenopathy Inguinal Nodes:  No significant adenopathy Psych:  Cognition and judgment appear intact. Alert and cooperative with normal attention span and concentration. No apparent delusions, illusions, hallucinations   Impression & Recommendations:  Problem # 1:  WELL ADULT EXAM (ICD-V70.0)  Orders: UA Dipstick w/o Micro (automated)  (81003) Venipuncture (04540) TLB-Lipid Panel (80061-LIPID) TLB-BMP (Basic Metabolic Panel-BMET) (80048-METABOL) TLB-CBC Platelet - w/Differential (85025-CBCD) TLB-Hepatic/Liver Function Pnl (80076-HEPATIC) TLB-TSH (Thyroid Stimulating Hormone) (84443-TSH) TLB-PSA (Prostate Specific Antigen) (84153-PSA) Specimen Handling (98119)  Problem # 2:  THYROMEGALY (ICD-240.9)  Orders: Radiology Referral (Radiology)  Complete Medication List: 1)  Diovan Hct 160-25 Mg Tabs (Valsartan-hydrochlorothiazide) .Marland Kitchen.. 1 by mouth once daily  Patient Instructions: 1)  Get labs today. Set up a thyroid US soon.  Prescriptions: DIOVAN HCT 160-25 MG  TABS (VALSARTAN-HYDROCHLOROTHIAZIDE) 1 by mouth once daily  #90 x 3   Entered and Authorized by:   Nelwyn Salisbury MD   Signed by:   Nelwyn Salisbury MD on 10/20/2010   Method used:   Print then Give to Patient   RxID:   [id number]    Orders Added: 1)  Est. Patient 40-64 years [99396] 2)  UA Dipstick w/o Micro (automated)  [81003] 3)  Venipuncture [84696] 4)  TLB-Lipid Panel [80061-LIPID] 5)  TLB-BMP (Basic Metabolic Panel-BMET) [80048-METABOL] 6)  TLB-CBC  Platelet - w/Differential [85025-CBCD] 7)  TLB-Hepatic/Liver Function Pnl [80076-HEPATIC] 8)  TLB-TSH (Thyroid Stimulating Hormone) [84443-TSH] 9)  TLB-PSA (Prostate Specific Antigen) [29528-UXL] 10)  Specimen Handling [99000] 11)  Radiology Referral [Radiology]    Laboratory Results   Urine Tests    Routine Urinalysis   Color: yellow Appearance: Clear Glucose: negative   (Normal Range: Negative) Bilirubin: negative   (Normal Range: Negative) Ketone: negative   (Normal Range: Negative) Spec. Gravity: 1.010   (Normal Range: 1.003-1.035) Blood: negative   (Normal Range: Negative) pH: 6.0   (Normal Range: 5.0-8.0) Protein: negative   (Normal Range: Negative) Urobilinogen: 0.2   (Normal Range: 0-1) Nitrite: negative   (Normal Range: Negative) Leukocyte Esterace: negative   (Normal Range: Negative)    Comments: Rita Ohara  October 20, 2010 11:02 AM

## 2011-03-26 NOTE — Op Note (Signed)
[name], Craig Nguyen                           ACCOUNT NO.:  [id number]   MEDICAL RECORD NO.:  [id number]                   PATIENT TYPE:  AMB   LOCATION:  ENDO                                 FACILITY:  [facility]   PHYSICIAN:  Anselmo Rod, M.D.               DATE OF BIRTH:  May 11, 1971   DATE OF PROCEDURE:  05/04/2004  DATE OF DISCHARGE:                                 OPERATIVE REPORT   PROCEDURE:  Colonoscopy.   ENDOSCOPIST:  Charna Elizabeth, M.D.   INSTRUMENT USED:  Olympus video colonoscope.   INDICATIONS FOR PROCEDURE:  Rectal bleeding in a 40 year old African  American male, rule out colonic polyps, masses, etc.   PREPROCEDURE PREPARATION:  Informed consent was obtained from the patient.  The patient was fasted for eight hours prior to the procedure and prepped  with a bottle of magnesium citrate and a gallon of GoLYTELY the night prior  to the procedure.   PREPROCEDURE PHYSICAL:  Patient with stable vital signs.  Neck supple.  Chest clear to auscultation.  S1 and S2 regular.  Abdomen soft with normal  bowel sounds.   DESCRIPTION OF PROCEDURE:  The patient was placed in the left lateral  decubitus position, sedated with 100 mg of Demerol and 10 mg Versed in slow  incremental doses.  Once the patient was adequately sedated, maintained on  low flow oxygen and continuous cardiac monitoring, the Olympus video  colonoscope was advanced from the rectum to the cecum.  The appendiceal  orifice and ileocecal valve were clearly visualized and photographed.  Isolated diverticulum was seen in the left colon.  Small internal  hemorrhoids were appreciated on retroflexion of the rectum.  The patient  tolerated the procedure well without complications.   IMPRESSION:  1. Small nonbleeding internal hemorrhoids.  2. 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  3. Unrevealing colonoscopy.   RECOMMENDATIONS:  1. Repeat colonoscopy has been recommended at age 46 unless the patient     develops  any problems in the interim.  2. Continue high fiber diet with liberal fluid intake.  3. Stool softeners as needed.  4. Outpatient follow up in the next two weeks or earlier if need be.                                               Anselmo Rod, M.D.    JNM/MEDQ  D:  05/04/2004  T:  05/04/2004  Job:  [id number]   cc:   Gabriel Earing, M.D.  [address]  [place]  [place] [address]  Fax: [phone]

## 2012-08-16 ENCOUNTER — Ambulatory Visit (INDEPENDENT_AMBULATORY_CARE_PROVIDER_SITE_OTHER): Payer: BC Managed Care – PPO | Admitting: Family Medicine

## 2012-08-16 ENCOUNTER — Encounter: Payer: Self-pay | Admitting: Family Medicine

## 2012-08-16 VITALS — BP 112/68 | HR 77 | Temp 98.7°F | Ht 71.75 in | Wt 229.0 lb

## 2012-08-16 DIAGNOSIS — E041 Nontoxic single thyroid nodule: Secondary | ICD-10-CM

## 2012-08-16 DIAGNOSIS — Z23 Encounter for immunization: Secondary | ICD-10-CM

## 2012-08-16 DIAGNOSIS — Z0000 Encounter for general adult medical examination without abnormal findings: Secondary | ICD-10-CM

## 2012-08-16 LAB — CBC WITH DIFFERENTIAL/PLATELET
Basophils Absolute: 0 10*3/uL (ref 0.0–0.1)
Basophils Relative: 0.5 % (ref 0.0–3.0)
Eosinophils Absolute: 0.2 10*3/uL (ref 0.0–0.7)
Eosinophils Relative: 2.3 % (ref 0.0–5.0)
HCT: 44.5 % (ref 39.0–52.0)
Hemoglobin: 14.6 g/dL (ref 13.0–17.0)
Lymphocytes Relative: 15.6 % (ref 12.0–46.0)
Lymphs Abs: 1.4 10*3/uL (ref 0.7–4.0)
MCHC: 32.7 g/dL (ref 30.0–36.0)
MCV: 90.6 fl (ref 78.0–100.0)
Monocytes Absolute: 0.5 10*3/uL (ref 0.1–1.0)
Monocytes Relative: 5.5 % (ref 3.0–12.0)
Neutro Abs: 6.6 10*3/uL (ref 1.4–7.7)
Neutrophils Relative %: 76.1 % (ref 43.0–77.0)
Platelets: 346 10*3/uL (ref 150.0–400.0)
RBC: 4.92 Mil/uL (ref 4.22–5.81)
RDW: 12.8 % (ref 11.5–14.6)
WBC: 8.7 10*3/uL (ref 4.5–10.5)

## 2012-08-16 LAB — BASIC METABOLIC PANEL
BUN: 14 mg/dL (ref 6–23)
CO2: 30 mEq/L (ref 19–32)
Calcium: 9.8 mg/dL (ref 8.4–10.5)
Chloride: 101 mEq/L (ref 96–112)
Creatinine, Ser: 1.1 mg/dL (ref 0.4–1.5)
GFR: 89.95 mL/min (ref >60.00)
Glucose, Bld: 101 mg/dL — ABNORMAL HIGH (ref 70–99)
Potassium: 4.1 mEq/L (ref 3.5–5.1)
Sodium: 138 mEq/L (ref 135–145)

## 2012-08-16 LAB — PSA: PSA: 0.2 ng/mL (ref 0.10–4.00)

## 2012-08-16 LAB — LIPID PANEL
Cholesterol: 182 mg/dL (ref 0–200)
HDL: 43.9 mg/dL (ref >39.00)
LDL Cholesterol: 121 mg/dL — ABNORMAL HIGH (ref 0–99)
Total CHOL/HDL Ratio: 4
Triglycerides: 88 mg/dL (ref 0.0–149.0)
VLDL: 17.6 mg/dL (ref 0.0–40.0)

## 2012-08-16 LAB — POCT URINALYSIS DIPSTICK
Bilirubin, UA: NEGATIVE
Blood, UA: NEGATIVE
Glucose, UA: NEGATIVE
Ketones, UA: NEGATIVE
Leukocytes, UA: NEGATIVE
Nitrite, UA: NEGATIVE
Protein, UA: NEGATIVE
Spec Grav, UA: 1.01
Urobilinogen, UA: 0.2
pH, UA: 6.5

## 2012-08-16 LAB — HEPATIC FUNCTION PANEL
ALT: 19 U/L (ref 0–53)
AST: 31 U/L (ref 0–37)
Albumin: 4.1 g/dL (ref 3.5–5.2)
Alkaline Phosphatase: 63 U/L (ref 39–117)
Bilirubin, Direct: 0.2 mg/dL (ref 0.0–0.3)
Total Bilirubin: 1.4 mg/dL — ABNORMAL HIGH (ref 0.3–1.2)
Total Protein: 7.6 g/dL (ref 6.0–8.3)

## 2012-08-16 LAB — TSH: TSH: 1.5 u[IU]/mL (ref 0.35–5.50)

## 2012-08-16 MED ORDER — VALSARTAN-HYDROCHLOROTHIAZIDE 160-25 MG PO TABS: 1.0000 | ORAL_TABLET | Freq: Every day | ORAL | Status: DC

## 2012-08-16 NOTE — Progress Notes (Signed)
  Subjective:    Patient ID: Craig Nguyen, male    DOB: 12/13/1970, 41 y.o.   MRN: [id number]  HPI 41 yr old male for a cpx. He feels fine. The nodule on is thyroid seems stable and has not changed at all. His last [place] was done in December 2011, now almost 2 years ago.    Review of Systems  Constitutional: Negative.   HENT: Negative.   Eyes: Negative.   Respiratory: Negative.   Cardiovascular: Negative.   Gastrointestinal: Negative.   Genitourinary: Negative.   Musculoskeletal: Negative.   Skin: Negative.   Neurological: Negative.   Hematological: Negative.   Psychiatric/Behavioral: Negative.        Objective:   Physical Exam  Constitutional: He is oriented to person, place, and time. He appears well-developed and well-nourished. No distress.  HENT:  Head: Normocephalic and atraumatic.  Right Ear: External ear normal.  Left Ear: External ear normal.  Nose: Nose normal.  Mouth/Throat: Oropharynx is clear and moist. No oropharyngeal exudate.  Eyes: Conjunctivae normal and EOM are normal. Pupils are equal, round, and reactive to light. Right eye exhibits no discharge. Left eye exhibits no discharge. No scleral icterus.  Neck: Neck supple. No JVD present. No tracheal deviation present. Thyromegaly present.       Fullness in the right thyroid lobe, not tender   Cardiovascular: Normal rate, regular rhythm, normal heart sounds and intact distal pulses.  Exam reveals no gallop and no friction rub.   No murmur heard. Pulmonary/Chest: Effort normal and breath sounds normal. No respiratory distress. He has no wheezes. He has no rales. He exhibits no tenderness.  Abdominal: Soft. Bowel sounds are normal. He exhibits no distension and no mass. There is no tenderness. There is no rebound and no guarding.  Genitourinary: Rectum normal, prostate normal and penis normal. Guaiac negative stool. No penile tenderness.  Musculoskeletal: Normal range of motion. He exhibits no edema and no  tenderness.  Lymphadenopathy:    He has no cervical adenopathy.  Neurological: He is alert and oriented to person, place, and time. He has normal reflexes. No cranial nerve deficit. He exhibits normal muscle tone. Coordination normal.  Skin: Skin is warm and dry. No rash noted. He is not diaphoretic. No erythema. No pallor.  Psychiatric: He has a normal mood and affect. His behavior is normal. Judgment and thought content normal.          Assessment & Plan:  Well exam. We will set up another thyroid US.

## 2012-08-18 NOTE — Progress Notes (Signed)
Quick Note:  I left voice message with results. ______ 

## 2012-08-21 ENCOUNTER — Ambulatory Visit
Admission: RE | Admit: 2012-08-21 | Discharge: 2012-08-21 | Disposition: A | Discharge status: Home / Self Care | Payer: BC Managed Care – PPO | Source: Ambulatory Visit | Attending: Family Medicine | Admitting: Family Medicine

## 2012-08-21 DIAGNOSIS — E041 Nontoxic single thyroid nodule: Secondary | ICD-10-CM

## 2012-08-25 NOTE — Progress Notes (Signed)
Quick Note:  I left voice message with results. ______ 

## 2012-11-22 IMAGING — US US SOFT TISSUE HEAD/NECK
1 series · 14 of 25 positions shown · non-contrast
Comparison: 12/01/2009

CLINICAL DATA: Enlarged thyroid.

THYROID ULTRASOUND
TECHNIQUE: Ultrasound examination of the thyroid gland and adjacent
soft tissues was performed.

[Series 1: us soft tissue head/neck · 0.09mm/px · 14 of 31 slices shown]
[im 1/31]
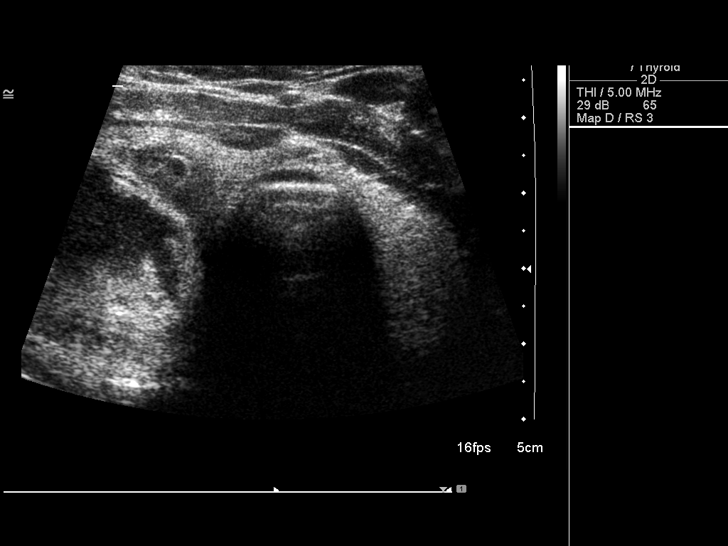
[im 3/31]
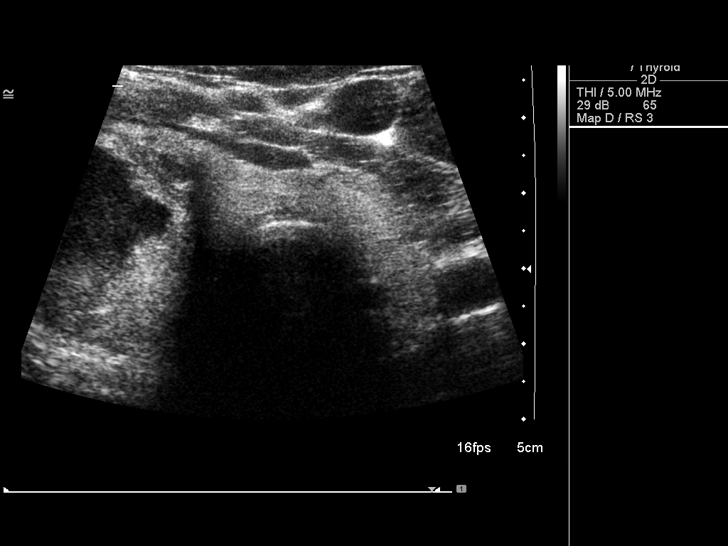
[im 6/31]
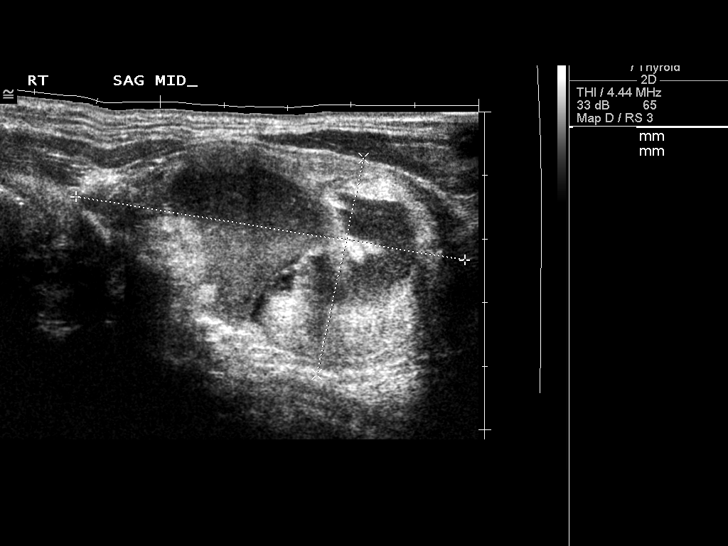
[im 8/31]
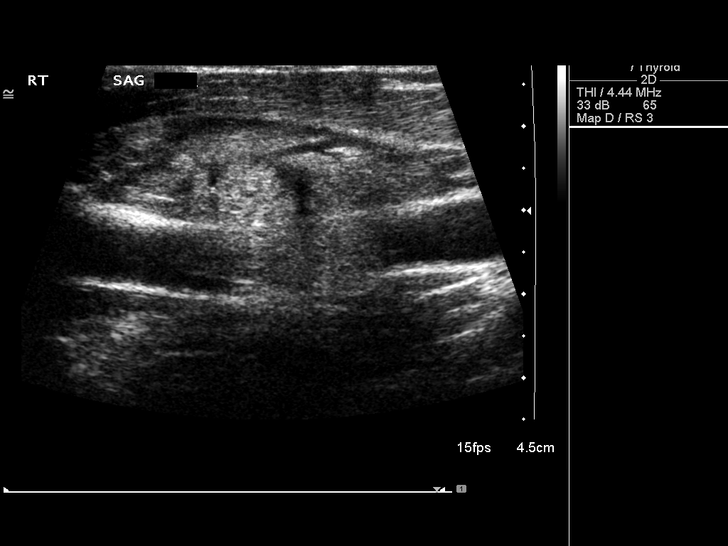
[im 11/31]
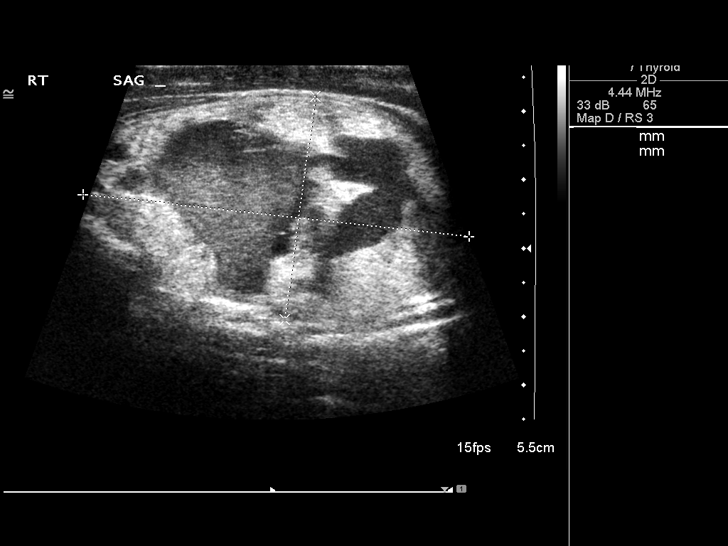
[im 12/31]
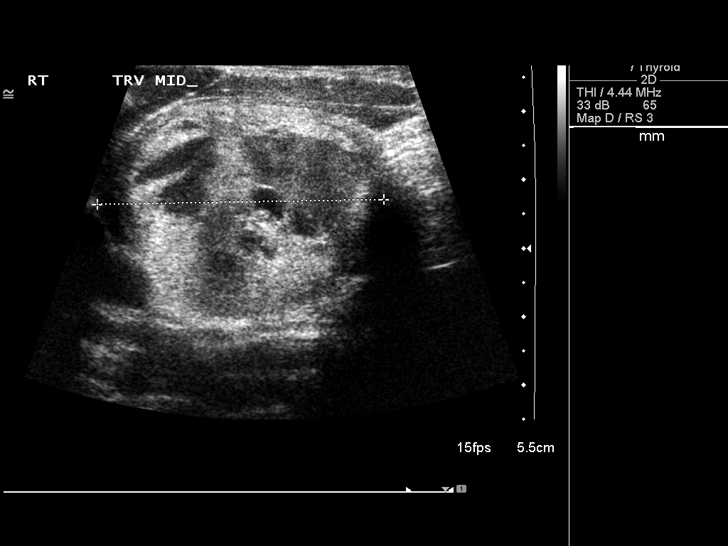
[im 14/31]
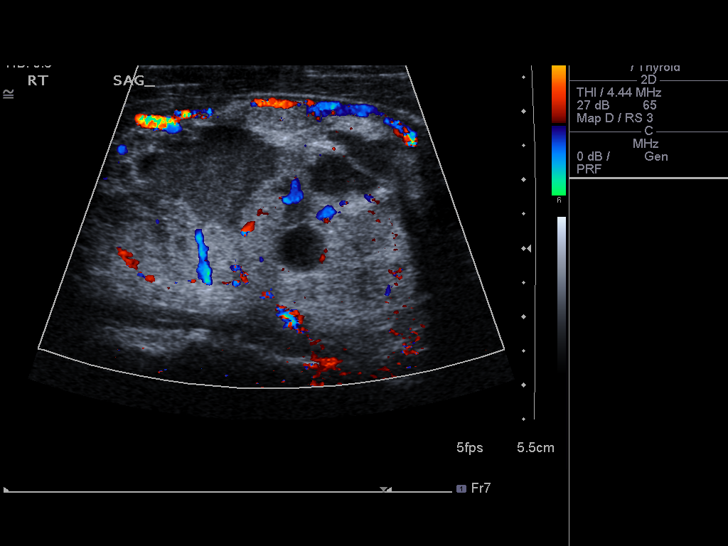
[im 17/31]
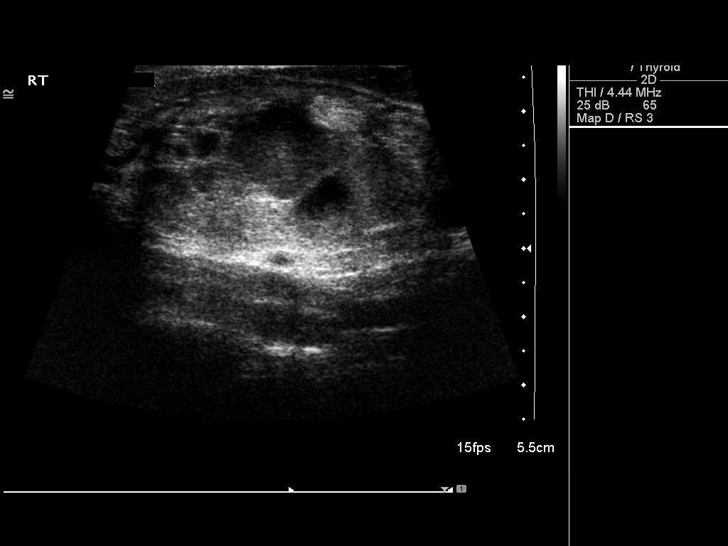
[im 19/31]
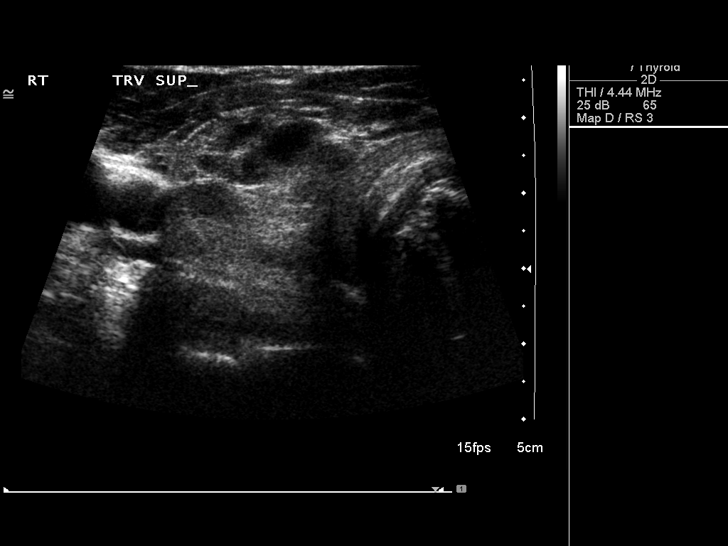
[im 21/31]
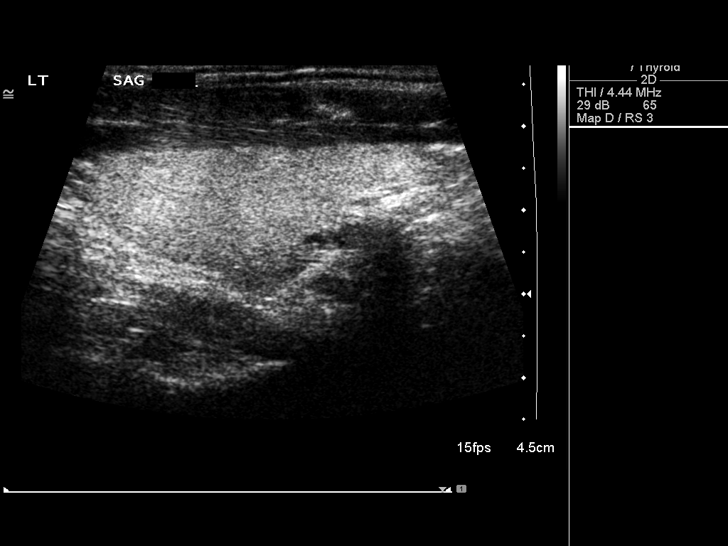
[im 23/31]
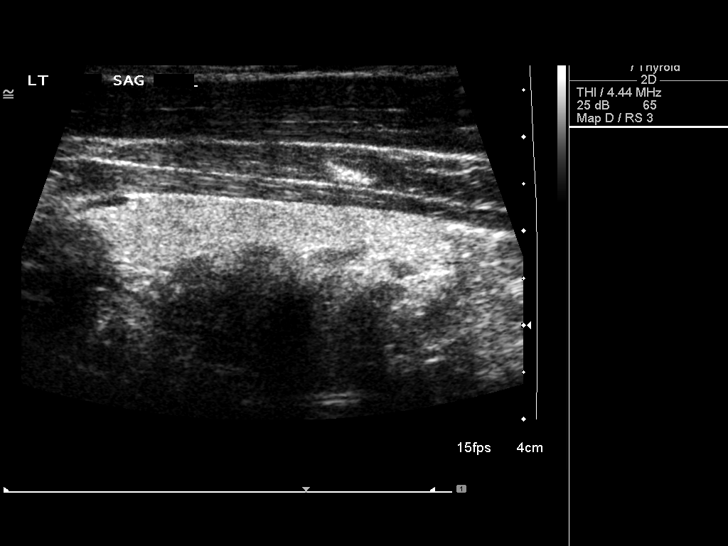
[im 26/31]
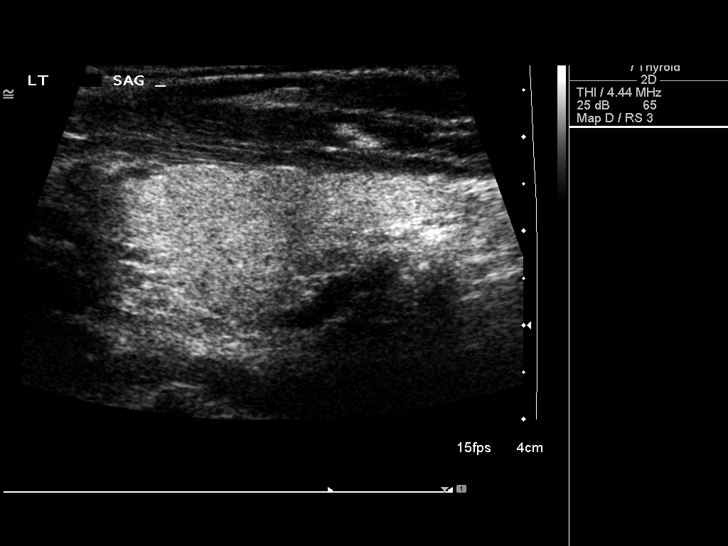
[im 28/31]
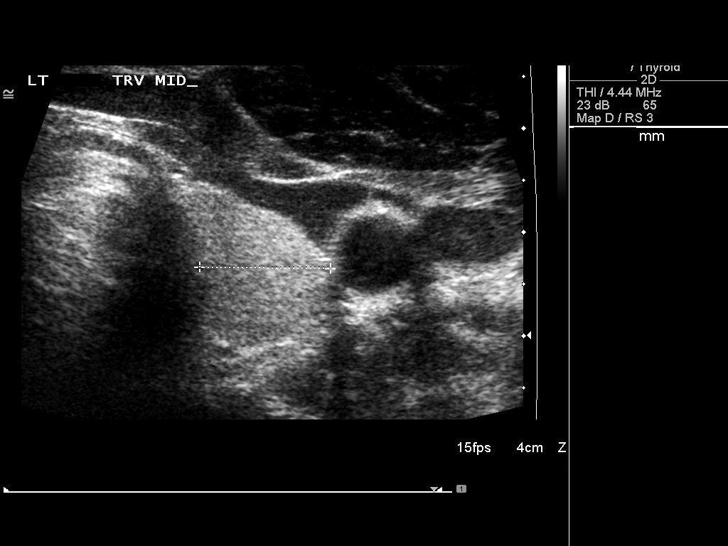
[im 31/31]
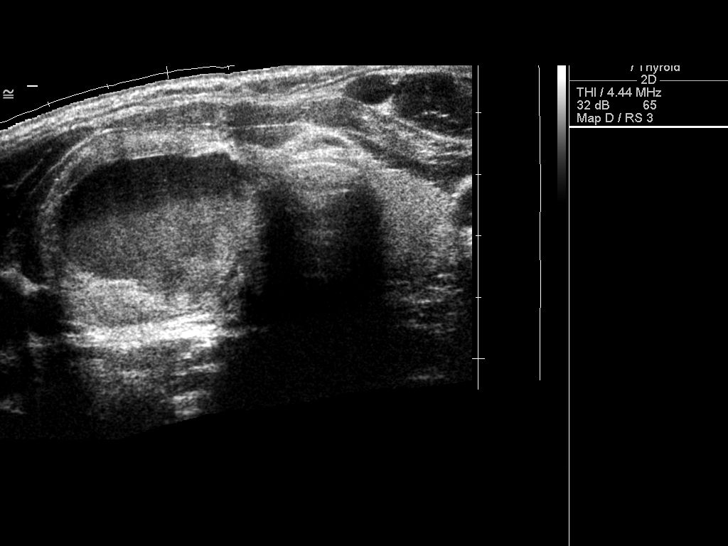

[14 of 25 positions shown; findings below may reference images not displayed]

FINDINGS: Right lobe of the thyroid measures 6.2 x 3.5 x 4.2 cm
and left lobe, 4.4 x 1.7 x 1.3 cm.  Isthmus measures 7 mm.
Echotexture is heterogeneous.  A complex cystic and solid mass
occupies nearly the entire right thyroid, measuring 5.7 x 3.3 x
cm (previously 5.7 x 3.4 x 4.1 cm).
IMPRESSION: Large complex cystic and solid nodule in the right thyroid is
stable in size.

## 2013-09-10 ENCOUNTER — Other Ambulatory Visit (INDEPENDENT_AMBULATORY_CARE_PROVIDER_SITE_OTHER): Payer: BC Managed Care – PPO

## 2013-09-10 DIAGNOSIS — Z0000 Encounter for general adult medical examination without abnormal findings: Secondary | ICD-10-CM

## 2013-09-10 LAB — CBC WITH DIFFERENTIAL/PLATELET
Basophils Absolute: 0 10*3/uL (ref 0.0–0.1)
Basophils Relative: 0.5 % (ref 0.0–3.0)
Eosinophils Absolute: 0.2 10*3/uL (ref 0.0–0.7)
Eosinophils Relative: 2 % (ref 0.0–5.0)
HCT: 42.5 % (ref 39.0–52.0)
Hemoglobin: 14.4 g/dL (ref 13.0–17.0)
Lymphocytes Relative: 20.8 % (ref 12.0–46.0)
Lymphs Abs: 1.9 10*3/uL (ref 0.7–4.0)
MCHC: 34 g/dL (ref 30.0–36.0)
MCV: 86.6 fl (ref 78.0–100.0)
Monocytes Absolute: 0.5 10*3/uL (ref 0.1–1.0)
Monocytes Relative: 6 % (ref 3.0–12.0)
Neutro Abs: 6.3 10*3/uL (ref 1.4–7.7)
Neutrophils Relative %: 70.7 % (ref 43.0–77.0)
Platelets: 322 10*3/uL (ref 150.0–400.0)
RBC: 4.91 Mil/uL (ref 4.22–5.81)
RDW: 13.1 % (ref 11.5–14.6)
WBC: 8.9 10*3/uL (ref 4.5–10.5)

## 2013-09-10 LAB — HEPATIC FUNCTION PANEL
ALT: 23 U/L (ref 0–53)
AST: 29 U/L (ref 0–37)
Albumin: 4 g/dL (ref 3.5–5.2)
Alkaline Phosphatase: 75 U/L (ref 39–117)
Bilirubin, Direct: 0.2 mg/dL (ref 0.0–0.3)
Total Bilirubin: 0.9 mg/dL (ref 0.3–1.2)
Total Protein: 7.4 g/dL (ref 6.0–8.3)

## 2013-09-10 LAB — POCT URINALYSIS DIPSTICK
Bilirubin, UA: NEGATIVE
Blood, UA: NEGATIVE
Glucose, UA: NEGATIVE
Ketones, UA: NEGATIVE
Leukocytes, UA: NEGATIVE
Nitrite, UA: NEGATIVE
Protein, UA: NEGATIVE
Spec Grav, UA: 1.015
Urobilinogen, UA: 0.2
pH, UA: 7

## 2013-09-10 LAB — TSH: TSH: 1.01 u[IU]/mL (ref 0.35–5.50)

## 2013-09-10 LAB — BASIC METABOLIC PANEL
BUN: 11 mg/dL (ref 6–23)
CO2: 30 mEq/L (ref 19–32)
Calcium: 9.6 mg/dL (ref 8.4–10.5)
Chloride: 102 mEq/L (ref 96–112)
Creatinine, Ser: 1 mg/dL (ref 0.4–1.5)
GFR: 99.52 mL/min (ref >60.00)
Glucose, Bld: 126 mg/dL — ABNORMAL HIGH (ref 70–99)
Potassium: 4.4 mEq/L (ref 3.5–5.1)
Sodium: 138 mEq/L (ref 135–145)

## 2013-09-10 LAB — LIPID PANEL
Cholesterol: 180 mg/dL (ref 0–200)
HDL: 40.6 mg/dL (ref >39.00)
LDL Cholesterol: 105 mg/dL — ABNORMAL HIGH (ref 0–99)
Total CHOL/HDL Ratio: 4
Triglycerides: 174 mg/dL — ABNORMAL HIGH (ref 0.0–149.0)
VLDL: 34.8 mg/dL (ref 0.0–40.0)

## 2013-09-10 LAB — PSA: PSA: 0.28 ng/mL (ref 0.10–4.00)

## 2013-09-11 LAB — HIV ANTIBODY (ROUTINE TESTING W REFLEX): HIV: NONREACTIVE

## 2013-09-11 LAB — GC/CHLAMYDIA PROBE AMP, URINE
Chlamydia, Swab/Urine, PCR: NEGATIVE
GC Probe Amp, Urine: NEGATIVE

## 2013-09-11 LAB — RPR

## 2013-09-14 NOTE — Progress Notes (Signed)
Quick Note:  I left voice message with results and just said that the extra testing we did came back negative. ______

## 2013-09-17 ENCOUNTER — Encounter: Payer: Self-pay | Admitting: Family Medicine

## 2013-09-17 ENCOUNTER — Ambulatory Visit (INDEPENDENT_AMBULATORY_CARE_PROVIDER_SITE_OTHER): Payer: BC Managed Care – PPO | Admitting: Family Medicine

## 2013-09-17 VITALS — BP 140/90 | HR 88 | Temp 98.4°F | Ht 71.5 in | Wt 236.0 lb

## 2013-09-17 DIAGNOSIS — Z0000 Encounter for general adult medical examination without abnormal findings: Secondary | ICD-10-CM

## 2013-09-17 DIAGNOSIS — Z202 Contact with and (suspected) exposure to infections with a predominantly sexual mode of transmission: Secondary | ICD-10-CM

## 2013-09-17 DIAGNOSIS — Z2089 Contact with and (suspected) exposure to other communicable diseases: Secondary | ICD-10-CM

## 2013-09-17 DIAGNOSIS — E119 Type 2 diabetes mellitus without complications: Secondary | ICD-10-CM

## 2013-09-17 MED ORDER — VALSARTAN-HYDROCHLOROTHIAZIDE 160-25 MG PO TABS: 1.0000 | ORAL_TABLET | Freq: Every day | ORAL | Status: DC

## 2013-09-17 NOTE — Progress Notes (Signed)
  Subjective:    Patient ID: Craig Nguyen, male    DOB: 1971/01/24, 42 y.o.   MRN: [id number]  HPI 42 yr old male for a cpx. He had been well until 4 days ago when he developed hoarseness and a dry cough. He feels a little better today. No fever. We discussed his new diagnosis of type 2 diabetes, and he admits that he has not exercised much and has been consuming a lot of calories lately.    Review of Systems  Constitutional: Negative.   HENT: Negative.   Eyes: Negative.   Respiratory: Negative.   Cardiovascular: Negative.   Gastrointestinal: Negative.   Genitourinary: Negative.   Musculoskeletal: Negative.   Skin: Negative.   Neurological: Negative.   Psychiatric/Behavioral: Negative.        Objective:   Physical Exam  Constitutional: He is oriented to person, place, and time. He appears well-developed and well-nourished. No distress.  HENT:  Head: Normocephalic and atraumatic.  Right Ear: External ear normal.  Left Ear: External ear normal.  Nose: Nose normal.  Mouth/Throat: Oropharynx is clear and moist. No oropharyngeal exudate.  Eyes: Conjunctivae and EOM are normal. Pupils are equal, round, and reactive to light. Right eye exhibits no discharge. Left eye exhibits no discharge. No scleral icterus.  Neck: Neck supple. No JVD present. No tracheal deviation present. No thyromegaly present.  Cardiovascular: Normal rate, regular rhythm, normal heart sounds and intact distal pulses.  Exam reveals no gallop and no friction rub.   No murmur heard. Pulmonary/Chest: Effort normal and breath sounds normal. No respiratory distress. He has no wheezes. He has no rales. He exhibits no tenderness.  Abdominal: Soft. Bowel sounds are normal. He exhibits no distension and no mass. There is no tenderness. There is no rebound and no guarding.  Genitourinary: Rectum normal, prostate normal and penis normal. Guaiac negative stool. No penile tenderness.  Musculoskeletal: Normal range of motion.  He exhibits no edema and no tenderness.  Lymphadenopathy:    He has no cervical adenopathy.  Neurological: He is alert and oriented to person, place, and time. He has normal reflexes. No cranial nerve deficit. He exhibits normal muscle tone. Coordination normal.  Skin: Skin is warm and dry. No rash noted. He is not diaphoretic. No erythema. No pallor.  Psychiatric: He has a normal mood and affect. His behavior is normal. Judgment and thought content normal.          Assessment & Plan:  Well exam. He will restrict his intake of carbs and exercise. His wife was recently found to check positive for "herpes" and he wants to be checked. She has fever blisters but has never had a genital rash. Mare has never had any of these rashes. Recheck glucose and BP in 6 months.

## 2013-09-24 ENCOUNTER — Telehealth: Payer: Self-pay | Admitting: Family Medicine

## 2013-09-24 NOTE — Telephone Encounter (Signed)
I called pt with lab results, pt was negative for exposure to herpes simplex. I only left a voice message stating that results were negative.

## 2013-11-12 ENCOUNTER — Ambulatory Visit (INDEPENDENT_AMBULATORY_CARE_PROVIDER_SITE_OTHER): Payer: BC Managed Care – PPO | Admitting: Emergency Medicine

## 2013-11-12 ENCOUNTER — Telehealth: Payer: Self-pay | Admitting: Family Medicine

## 2013-11-12 VITALS — BP 130/88 | HR 103 | Temp 98.6°F | Resp 16 | Ht 70.75 in | Wt 210.2 lb

## 2013-11-12 DIAGNOSIS — E119 Type 2 diabetes mellitus without complications: Secondary | ICD-10-CM

## 2013-11-12 LAB — POCT URINALYSIS DIPSTICK
Bilirubin, UA: NEGATIVE
Blood, UA: NEGATIVE
Glucose, UA: 500
Ketones, UA: 80
Leukocytes, UA: NEGATIVE
Nitrite, UA: NEGATIVE
Protein, UA: NEGATIVE
Spec Grav, UA: 1.01
Urobilinogen, UA: 0.2
pH, UA: 5

## 2013-11-12 LAB — POCT GLYCOSYLATED HEMOGLOBIN (HGB A1C): Hemoglobin A1C: 11.7

## 2013-11-12 LAB — POCT CBC
Granulocyte percent: 81.8 %G — AB (ref 37–80)
HCT, POC: 48.8 % (ref 43.5–53.7)
Hemoglobin: 15.8 g/dL (ref 14.1–18.1)
Lymph, poc: 1.8 (ref 0.6–3.4)
MCH, POC: 29.1 pg (ref 27–31.2)
MCHC: 32.4 g/dL (ref 31.8–35.4)
MCV: 89.9 fL (ref 80–97)
MID (cbc): 0.6 (ref 0–0.9)
MPV: 9.6 fL (ref 0–99.8)
POC Granulocyte: 10.5 — AB (ref 2–6.9)
POC LYMPH PERCENT: 13.9 %L (ref 10–50)
POC MID %: 4.3 %M (ref 0–12)
Platelet Count, POC: 417 10*3/uL (ref 142–424)
RBC: 5.43 M/uL (ref 4.69–6.13)
RDW, POC: 12.7 %
WBC: 12.8 10*3/uL — AB (ref 4.6–10.2)

## 2013-11-12 LAB — GLUCOSE, POCT (MANUAL RESULT ENTRY): POC Glucose: 430 mg/dl — AB (ref 70–99)

## 2013-11-12 MED ORDER — METFORMIN HCL ER 500 MG PO TB24: ORAL_TABLET | ORAL | Status: DC

## 2013-11-12 MED ORDER — LISINOPRIL 10 MG PO TABS: 10.0000 mg | ORAL_TABLET | Freq: Every day | ORAL | Status: DC

## 2013-11-12 MED ORDER — FREESTYLE SYSTEM KIT: 1.0000 | PACK | Status: DC | PRN

## 2013-11-12 MED ORDER — LIRAGLUTIDE 18 MG/3ML ~~LOC~~ SOPN: PEN_INJECTOR | SUBCUTANEOUS | Status: DC

## 2013-11-12 MED ORDER — INSULIN PEN NEEDLE 31G X 5 MM MISC: 1.0000 | Freq: Every morning | Status: DC

## 2013-11-12 NOTE — Patient Instructions (Signed)
Type 2 Diabetes Mellitus, Adult Type 2 diabetes mellitus, often simply referred to as type 2 diabetes, is a long-lasting (chronic) disease. In type 2 diabetes, the pancreas does not make enough insulin (a hormone), the cells are less responsive to the insulin that is made (insulin resistance), or both. Normally, insulin moves sugars from food into the tissue cells. The tissue cells use the sugars for energy. The lack of insulin or the lack of normal response to insulin causes excess sugars to build up in the blood instead of going into the tissue cells. As a result, high blood sugar (hyperglycemia) develops. The effect of high sugar (glucose) levels can cause many complications. Type 2 diabetes was also previously called adult-onset diabetes but it can occur at any age.  RISK FACTORS  A person is predisposed to developing type 2 diabetes if someone in the family has the disease and also has one or more of the following primary risk factors:  Overweight.  An inactive lifestyle.  A history of consistently eating high-calorie foods. Maintaining a normal weight and regular physical activity can reduce the chance of developing type 2 diabetes. SYMPTOMS  A person with type 2 diabetes may not show symptoms initially. The symptoms of type 2 diabetes appear slowly. The symptoms include:  Increased thirst (polydipsia).  Increased urination (polyuria).  Increased urination during the night (nocturia).  Weight loss. This weight loss may be rapid.  Frequent, recurring infections.  Tiredness (fatigue).  Weakness.  Vision changes, such as blurred vision.  Fruity smell to your breath.  Abdominal pain.  Nausea or vomiting.  Cuts or bruises which are slow to heal.  Tingling or numbness in the hands or feet. DIAGNOSIS Type 2 diabetes is frequently not diagnosed until complications of diabetes are present. Type 2 diabetes is diagnosed when symptoms or complications are present and when blood  glucose levels are increased. Your blood glucose level may be checked by one or more of the following blood tests:  A fasting blood glucose test. You will not be allowed to eat for at least 8 hours before a blood sample is taken.  A random blood glucose test. Your blood glucose is checked at any time of the day regardless of when you ate.  A hemoglobin A1c blood glucose test. A hemoglobin A1c test provides information about blood glucose control over the previous 3 months.  An oral glucose tolerance test (OGTT). Your blood glucose is measured after you have not eaten (fasted) for 2 hours and then after you drink a glucose-containing beverage. TREATMENT   You may need to take insulin or diabetes medicine daily to keep blood glucose levels in the desired range.  You will need to match insulin dosing with exercise and healthy food choices. The treatment goal is to maintain the before meal blood sugar (preprandial glucose) level at 70 130 mg/dL. HOME CARE INSTRUCTIONS   Have your hemoglobin A1c level checked twice a year.  Perform daily blood glucose monitoring as directed by your caregiver.  Monitor urine ketones when you are ill and as directed by your caregiver.  Take your diabetes medicine or insulin as directed by your caregiver to maintain your blood glucose levels in the desired range.  Never run out of diabetes medicine or insulin. It is needed every day.  Adjust insulin based on your intake of carbohydrates. Carbohydrates can raise blood glucose levels but need to be included in your diet. Carbohydrates provide vitamins, minerals, and fiber which are an essential part of   a healthy diet. Carbohydrates are found in fruits, vegetables, whole grains, dairy products, legumes, and foods containing added sugars.    Eat healthy foods. Alternate 3 meals with 3 snacks.  Lose weight if overweight.  Carry a medical alert card or wear your medical alert jewelry.  Carry a 15 gram  carbohydrate snack with you at all times to treat low blood glucose (hypoglycemia). Some examples of 15 gram carbohydrate snacks include:  Glucose tablets, 3 or 4   Glucose gel, 15 gram tube  Raisins, 2 tablespoons (24 grams)  Jelly beans, 6  Animal crackers, 8  Regular pop, 4 ounces (120 mL)  Gummy treats, 9  Recognize hypoglycemia. Hypoglycemia occurs with blood glucose levels of 70 mg/dL and below. The risk for hypoglycemia increases when fasting or skipping meals, during or after intense exercise, and during sleep. Hypoglycemia symptoms can include:  Tremors or shakes.  Decreased ability to concentrate.  Sweating.  Increased heart rate.  Headache.  Dry mouth.  Hunger.  Irritability.  Anxiety.  Restless sleep.  Altered speech or coordination.  Confusion.  Treat hypoglycemia promptly. If you are alert and able to safely swallow, follow the 15:15 rule:  Take 15 20 grams of rapid-acting glucose or carbohydrate. Rapid-acting options include glucose gel, glucose tablets, or 4 ounces (120 mL) of fruit juice, regular soda, or low fat milk.  Check your blood glucose level 15 minutes after taking the glucose.  Take 15 20 grams more of glucose if the repeat blood glucose level is still 70 mg/dL or below.  Eat a meal or snack within 1 hour once blood glucose levels return to normal.    Be alert to polyuria and polydipsia which are early signs of hyperglycemia. An early awareness of hyperglycemia allows for prompt treatment. Treat hyperglycemia as directed by your caregiver.  Engage in at least 150 minutes of moderate-intensity physical activity a week, spread over at least 3 days of the week or as directed by your caregiver. In addition, you should engage in resistance exercise at least 2 times a week or as directed by your caregiver.  Adjust your medicine and food intake as needed if you start a new exercise or sport.  Follow your sick day plan at any time you  are unable to eat or drink as usual.  Avoid tobacco use.  Limit alcohol intake to no more than 1 drink per day for nonpregnant women and 2 drinks per day for men. You should drink alcohol only when you are also eating food. Talk with your caregiver whether alcohol is safe for you. Tell your caregiver if you drink alcohol several times a week.  Follow up with your caregiver regularly.  Schedule an eye exam soon after the diagnosis of type 2 diabetes and then annually.  Perform daily skin and foot care. Examine your skin and feet daily for cuts, bruises, redness, nail problems, bleeding, blisters, or sores. A foot exam by a caregiver should be done annually.  Brush your teeth and gums at least twice a day and floss at least once a day. Follow up with your dentist regularly.  Share your diabetes management plan with your workplace or school.  Stay up-to-date with immunizations.  Learn to manage stress.  Obtain ongoing diabetes education and support as needed.  Participate in, or seek rehabilitation as needed to maintain or improve independence and quality of life. Request a physical or occupational therapy referral if you are having foot or hand numbness or difficulties with grooming,   dressing, eating, or physical activity. SEEK MEDICAL CARE IF:   You are unable to eat food or drink fluids for more than 6 hours.  You have nausea and vomiting for more than 6 hours.  Your blood glucose level is over 240 mg/dL.  There is a change in mental status.  You develop an additional serious illness.  You have diarrhea for more than 6 hours.  You have been sick or have had a fever for a couple of days and are not getting better.  You have pain during any physical activity.  SEEK IMMEDIATE MEDICAL CARE IF:  You have difficulty breathing.  You have moderate to large ketone levels. MAKE SURE YOU:  Understand these instructions.  Will watch your condition.  Will get help right away if  you are not doing well or get worse. Document Released: 10/25/2005 Document Revised: 07/19/2012 Document Reviewed: 05/23/2012 [employer] Patient Information 2014 [employer].  

## 2013-11-12 NOTE — Telephone Encounter (Signed)
Per Dr. Clent RidgesFry, please schedule the pt for Tuesday 11/13/13.

## 2013-11-12 NOTE — Telephone Encounter (Signed)
Pt would like appt asap/ pt states at last appt he was border line diabetic. Pt has continual thirst, weight loss, no energy, contant urination. Would like to come in today if possible. Will see someone else if need to, but really insist today for appt. pls advise.

## 2013-11-12 NOTE — Progress Notes (Signed)
[facility] [address], [place] [id number] [phone]  Date:  11/12/2013   Name:  Craig MinaLeonard G Nguyen   DOB:  [birth year]   MRN:  [id number]  PCP:  Nelwyn SalisburyFRY,STEPHEN A, MD    Chief Complaint: Dry mouth, Constant thirst and Increased frequency of urination   History of Present Illness:  Craig Nguyen is a 43 y.o. very pleasant male patient who presents with the following:  Told by FMD that he was borderline diabetic and wanted a four month follow up.  Over the past three weeks has noted poor appetite, polyuria and polydipsia, nausea but no vomiting.   Says he has lost 24 pounds since last doctor visit.  Had an elevated sugar over past 6 years.  Has family history of diabetes in parents and siblings.   No fever or chills, GI or GU symptoms.  No cough or coryza.  Not fully fasting presently.  No improvement with over the counter medications or other home remedies. Denies other complaint or health concern today.   Patient Active Problem List   Diagnosis Date Noted  . Type II or unspecified type diabetes mellitus without mention of complication, not stated as uncontrolled 09/17/2013  . THYROMEGALY 07/17/2009  . HERPES ZOSTER 04/25/2009  . HYPERTENSION 10/16/2007  . CHICKENPOX, HX OF 10/16/2007    Past Medical History  Diagnosis Date  . Hypertension   . Thyroid nodule     right side & benign    Past Surgical History  Procedure Laterality Date  . Vasectomy    . Colonoscopy  08/05/08    Dr. Loreta AveMann, repeat at age 89    History  Substance Use Topics  . Smoking status: Never Smoker   . Smokeless tobacco: Never Used  . Alcohol Use: 0.5 oz/week    1 drink(s) per week    Family History  Problem Relation Age of Onset  . Diabetes    . Hypertension    . Prostate cancer    . Sarcoidosis      pulmonary, sister  . Diabetes Mother   . Hypertension Mother   . Diabetes Father   . Hypertension Father   . Diabetes Brother   . Hypertension Brother     Allergies   Allergen Reactions  . Aspirin     Medication list has been reviewed and updated.  Current Outpatient Prescriptions on File Prior to Visit  Medication Sig Dispense Refill  . valsartan-hydrochlorothiazide (DIOVAN-HCT) 160-25 MG per tablet Take 1 tablet by mouth daily.  90 tablet  3   No current facility-administered medications on file prior to visit.    Review of Systems:  As per HPI, otherwise negative.    Physical Examination: Filed Vitals:   11/12/13 1419  BP: 130/88  Pulse: 103  Temp: 98.6 F (37 C)  Resp: 16   Filed Vitals:   11/12/13 1419  Height: 5' 10.75" (1.797 m)  Weight: 210 lb 3.2 oz (95.346 kg)   Body mass index is 29.53 kg/(m^2). Ideal Body Weight: Weight in (lb) to have BMI = 25: 177.6  GEN: WDWN, NAD, Non-toxic, A & O x 3 HEENT: Atraumatic, Normocephalic. Neck supple. No masses, No LAD. Ears and Nose: No external deformity. CV: RRR, No M/G/R. No JVD. No thrill. No extra heart sounds. PULM: CTA B, no wheezes, crackles, rhonchi. No retractions. No resp. distress. No accessory muscle use. ABD: S, NT, ND, +BS. No rebound. No HSM. EXTR: No c/c/e NEURO Normal gait.  PSYCH:  Normally interactive. Conversant. Not depressed or anxious appearing.  Calm demeanor.    Assessment and Plan: Niddm uncontrolled Glucometer Metformin increasing dose victoza Follow up with FMD  Signed,  Phillips Odor, MD   Results for orders placed in visit on 11/12/13  POCT CBC      Result Value Range   WBC 12.8 (*) 4.6 - 10.2 K/uL   Lymph, poc 1.8  0.6 - 3.4   POC LYMPH PERCENT 13.9  10 - 50 %L   MID (cbc) 0.6  0 - 0.9   POC MID % 4.3  0 - 12 %M   POC Granulocyte 10.5 (*) 2 - 6.9   Granulocyte percent 81.8 (*) 37 - 80 %G   RBC 5.43  4.69 - 6.13 M/uL   Hemoglobin 15.8  14.1 - 18.1 g/dL   HCT, POC 16.1  09.6 - 53.7 %   MCV 89.9  80 - 97 fL   MCH, POC 29.1  27 - 31.2 pg   MCHC 32.4  31.8 - 35.4 g/dL   RDW, POC 04.5     Platelet Count, POC 417  142 - 424 K/uL    MPV 9.6  0 - 99.8 fL  POCT GLYCOSYLATED HEMOGLOBIN (HGB A1C)      Result Value Range   Hemoglobin A1C 11.7    GLUCOSE, POCT (MANUAL RESULT ENTRY)      Result Value Range   POC Glucose 430 (*) 70 - 99 mg/dl  POCT URINALYSIS DIPSTICK      Result Value Range   Color, UA yellow     Clarity, UA clear     Glucose, UA 500     Bilirubin, UA neg     Ketones, UA 80     Spec Grav, UA 1.010     Blood, UA neg     pH, UA 5.0     Protein, UA neg     Urobilinogen, UA 0.2     Nitrite, UA neg     Leukocytes, UA Negative

## 2013-11-12 NOTE — Telephone Encounter (Signed)
Pt states he may go to UC today. Will keep appt tomorrow, and will let us know later today or first thing in the am

## 2013-11-13 ENCOUNTER — Encounter: Payer: Self-pay | Admitting: Family Medicine

## 2013-11-13 ENCOUNTER — Ambulatory Visit (INDEPENDENT_AMBULATORY_CARE_PROVIDER_SITE_OTHER): Payer: BC Managed Care – PPO | Admitting: Family Medicine

## 2013-11-13 VITALS — BP 140/88 | HR 109 | Temp 98.1°F | Wt 213.0 lb

## 2013-11-13 DIAGNOSIS — E119 Type 2 diabetes mellitus without complications: Secondary | ICD-10-CM

## 2013-11-13 LAB — COMPREHENSIVE METABOLIC PANEL
ALT: 16 U/L (ref 0–53)
AST: 18 U/L (ref 0–37)
Albumin: 4.6 g/dL (ref 3.5–5.2)
Alkaline Phosphatase: 107 U/L (ref 39–117)
BUN: 25 mg/dL — ABNORMAL HIGH (ref 6–23)
CO2: 23 mEq/L (ref 19–32)
Calcium: 10.3 mg/dL (ref 8.4–10.5)
Chloride: 90 mEq/L — ABNORMAL LOW (ref 96–112)
Creat: 1.24 mg/dL (ref 0.50–1.35)
Glucose, Bld: 425 mg/dL — ABNORMAL HIGH (ref 70–99)
Potassium: 4.5 mEq/L (ref 3.5–5.3)
Sodium: 131 mEq/L — ABNORMAL LOW (ref 135–145)
Total Bilirubin: 1.1 mg/dL (ref 0.3–1.2)
Total Protein: 8 g/dL (ref 6.0–8.3)

## 2013-11-13 LAB — LIPASE: Lipase: 60 U/L (ref 0–75)

## 2013-11-13 LAB — TSH: TSH: 0.985 u[IU]/mL (ref 0.350–4.500)

## 2013-11-13 LAB — AMYLASE: Amylase: 52 U/L (ref 0–105)

## 2013-11-13 NOTE — Progress Notes (Signed)
Pre visit review using our clinic review tool, if applicable. No additional management support is needed unless otherwise documented below in the visit note. 

## 2013-11-13 NOTE — Progress Notes (Signed)
   Subjective:    Patient ID: Craig Nguyen, male    DOB: 09-Feb-1971, 43 y.o.   MRN: [id number]  HPI Here with his wife to follow up on a new diagnosis of diabetes. He has had borderline glucoses for years but over the past month he noticed weight loss, thirst, and frequent urinations. He went to Urgent Care yesterday where a random glucoes was 469 and an A1c was 11.7. He was started on Metformin 500 mg a day to titrate up and and on Victoza. He has not filled any of these rx however because the Victoza was so expensive. He has been drinking lots of water. He feels a little tired today. He has a strong family hx of DM. He was referred to Endocrinology and this is pending.    Review of Systems  Constitutional: Negative.   Respiratory: Negative.   Cardiovascular: Negative.   Neurological: Negative.        Objective:   Physical Exam  Constitutional: He appears well-developed and well-nourished. No distress.  Cardiovascular: Normal rate, regular rhythm, normal heart sounds and intact distal pulses.   Pulmonary/Chest: Effort normal and breath sounds normal.          Assessment & Plan:  We went ahead and increase the Metformin to 1000 mg bid to begin ASAP. Instead of Victoza we gave him samples to start Onglyza 5 mg daily. We will refer him to Nutrition as well.

## 2013-11-14 ENCOUNTER — Telehealth: Payer: Self-pay

## 2013-11-14 MED ORDER — METFORMIN HCL ER 500 MG PO TB24: 1000.0000 mg | ORAL_TABLET | Freq: Two times a day (BID) | ORAL | Status: DC

## 2013-11-14 MED ORDER — SAXAGLIPTIN HCL 2.5 MG PO TABS: 5.0000 mg | ORAL_TABLET | Freq: Every day | ORAL | Status: DC

## 2013-11-14 NOTE — Telephone Encounter (Signed)
Relevant patient education mailed to patient.  

## 2013-11-14 NOTE — Addendum Note (Signed)
Addended by: Gershon CraneFRY, Rafaella Kole A on: 11/14/2013 08:28 AM   Modules accepted: Orders

## 2013-11-15 ENCOUNTER — Telehealth: Payer: Self-pay | Admitting: Family Medicine

## 2013-11-15 NOTE — Telephone Encounter (Signed)
Patient Information:  Caller Name: [place]  Phone: [phone]  Patient: Craig Nguyen, Craig Nguyen  Gender: Male  DOB: 1971-07-19  Age: 43 Years  PCP: Gershon CraneFry, Stephen [facility](Family Practice)  Office Follow Up:  Does the office need to follow up with this patient?: Yes  Instructions For The Office: Please call and give further instructions.  RN Note:  Wife would like a message sent to the doctor regarding the symptoms. She would prefer you contact the patient directly at: [phone]. Please call to give further instructions.  Symptoms  Reason For Call & Symptoms: Vomiting, high blood sugar. Reports patient had one episode of vomting after eating today, but has felt nauseated at times since yesterday. Wife thinks that the medication is causing the vomiting. Last blood glucose reading 329.  Reviewed Health History In EMR: Yes  Reviewed Medications In EMR: Yes  Reviewed Allergies In EMR: Yes  Reviewed Surgeries / Procedures: Yes  Date of Onset of Symptoms: 11/14/2013  Treatments Tried: Metformin  Treatments Tried Worked: No  Guideline(s) Used:  Diabetes - High Blood Sugar  Disposition Per Guideline:   Discuss with PCP and Callback by Nurse within 1 Hour  Reason For Disposition Reached:   Blood glucose > 300 mg/dl (29.516.5 mmol/l) AND two or more times in a row  Advice Given:  N/A  Patient Will Follow Care Advice:  YES

## 2013-11-16 ENCOUNTER — Encounter (HOSPITAL_COMMUNITY): Payer: Self-pay | Admitting: Emergency Medicine

## 2013-11-16 ENCOUNTER — Telehealth: Payer: Self-pay | Admitting: Family Medicine

## 2013-11-16 ENCOUNTER — Inpatient Hospital Stay (HOSPITAL_COMMUNITY)
Admission: EM | Admit: 2013-11-16 | Discharge: 2013-11-19 | DRG: 639 | Disposition: A | Discharge status: Home / Self Care | Payer: BC Managed Care – PPO | Attending: Internal Medicine | Admitting: Internal Medicine

## 2013-11-16 DIAGNOSIS — E876 Hypokalemia: Secondary | ICD-10-CM | POA: Diagnosis present

## 2013-11-16 DIAGNOSIS — E119 Type 2 diabetes mellitus without complications: Secondary | ICD-10-CM

## 2013-11-16 DIAGNOSIS — I10 Essential (primary) hypertension: Secondary | ICD-10-CM | POA: Diagnosis present

## 2013-11-16 DIAGNOSIS — Z8042 Family history of malignant neoplasm of prostate: Secondary | ICD-10-CM

## 2013-11-16 DIAGNOSIS — Z794 Long term (current) use of insulin: Secondary | ICD-10-CM

## 2013-11-16 DIAGNOSIS — E1110 Type 2 diabetes mellitus with ketoacidosis without coma: Secondary | ICD-10-CM | POA: Diagnosis present

## 2013-11-16 DIAGNOSIS — Z836 Family history of other diseases of the respiratory system: Secondary | ICD-10-CM

## 2013-11-16 DIAGNOSIS — Z833 Family history of diabetes mellitus: Secondary | ICD-10-CM

## 2013-11-16 DIAGNOSIS — Z9852 Vasectomy status: Secondary | ICD-10-CM

## 2013-11-16 DIAGNOSIS — Z886 Allergy status to analgesic agent status: Secondary | ICD-10-CM

## 2013-11-16 DIAGNOSIS — Z8249 Family history of ischemic heart disease and other diseases of the circulatory system: Secondary | ICD-10-CM

## 2013-11-16 DIAGNOSIS — Z79899 Other long term (current) drug therapy: Secondary | ICD-10-CM

## 2013-11-16 DIAGNOSIS — E1310 Other specified diabetes mellitus with ketoacidosis without coma: Principal | ICD-10-CM | POA: Diagnosis present

## 2013-11-16 LAB — CBC WITH DIFFERENTIAL/PLATELET
Basophils Absolute: 0 10*3/uL (ref 0.0–0.1)
Basophils Relative: 0 % (ref 0–1)
Eosinophils Absolute: 0 10*3/uL (ref 0.0–0.7)
Eosinophils Relative: 0 % (ref 0–5)
HCT: 45.4 % (ref 39.0–52.0)
Hemoglobin: 16.5 g/dL (ref 13.0–17.0)
Lymphocytes Relative: 7 % — ABNORMAL LOW (ref 12–46)
Lymphs Abs: 1.1 10*3/uL (ref 0.7–4.0)
MCH: 30.1 pg (ref 26.0–34.0)
MCHC: 36.3 g/dL — ABNORMAL HIGH (ref 30.0–36.0)
MCV: 82.7 fL (ref 78.0–100.0)
Monocytes Absolute: 0.5 10*3/uL (ref 0.1–1.0)
Monocytes Relative: 3 % (ref 3–12)
Neutro Abs: 15.1 10*3/uL — ABNORMAL HIGH (ref 1.7–7.7)
Neutrophils Relative %: 91 % — ABNORMAL HIGH (ref 43–77)
Platelets: 453 10*3/uL — ABNORMAL HIGH (ref 150–400)
RBC: 5.49 MIL/uL (ref 4.22–5.81)
RDW: 12 % (ref 11.5–15.5)
WBC: 16.7 10*3/uL — ABNORMAL HIGH (ref 4.0–10.5)

## 2013-11-16 LAB — GLUCOSE, CAPILLARY
Glucose-Capillary: 500 mg/dL — ABNORMAL HIGH (ref 70–99)
Glucose-Capillary: 545 mg/dL — ABNORMAL HIGH (ref 70–99)
Glucose-Capillary: 545 mg/dL — ABNORMAL HIGH (ref 70–99)
Glucose-Capillary: 405 mg/dL — ABNORMAL HIGH (ref 70–99)
Glucose-Capillary: 434 mg/dL — ABNORMAL HIGH (ref 70–99)

## 2013-11-16 LAB — BASIC METABOLIC PANEL
BUN: 33 mg/dL — ABNORMAL HIGH (ref 6–23)
CO2: 18 mEq/L — ABNORMAL LOW (ref 19–32)
Calcium: 9.2 mg/dL (ref 8.4–10.5)
Chloride: 97 mEq/L (ref 96–112)
Creatinine, Ser: 1.25 mg/dL (ref 0.50–1.35)
GFR calc Af Amer: 79 mL/min — ABNORMAL LOW (ref >90)
GFR calc non Af Amer: 69 mL/min — ABNORMAL LOW (ref >90)
Glucose, Bld: 437 mg/dL — ABNORMAL HIGH (ref 70–99)
Potassium: 4.6 mEq/L (ref 3.7–5.3)
Sodium: 141 mEq/L (ref 137–147)

## 2013-11-16 LAB — URINALYSIS, ROUTINE W REFLEX MICROSCOPIC
Bilirubin Urine: NEGATIVE
Glucose, UA: >1000 mg/dL — AB
Hgb urine dipstick: NEGATIVE
Ketones, ur: 40 mg/dL — AB
Leukocytes, UA: NEGATIVE
Nitrite: NEGATIVE
Protein, ur: NEGATIVE mg/dL
Specific Gravity, Urine: 1.023 (ref 1.005–1.030)
Urobilinogen, UA: 0.2 mg/dL (ref 0.0–1.0)
pH: 5 (ref 5.0–8.0)

## 2013-11-16 LAB — COMPREHENSIVE METABOLIC PANEL
ALT: 10 U/L (ref 0–53)
AST: 17 U/L (ref 0–37)
Albumin: 4.3 g/dL (ref 3.5–5.2)
Alkaline Phosphatase: 101 U/L (ref 39–117)
BUN: 34 mg/dL — ABNORMAL HIGH (ref 6–23)
CO2: 16 mEq/L — ABNORMAL LOW (ref 19–32)
Calcium: 9.8 mg/dL (ref 8.4–10.5)
Chloride: 87 mEq/L — ABNORMAL LOW (ref 96–112)
Creatinine, Ser: 1.35 mg/dL (ref 0.50–1.35)
GFR calc Af Amer: 72 mL/min — ABNORMAL LOW (ref >90)
GFR calc non Af Amer: 62 mL/min — ABNORMAL LOW (ref >90)
Glucose, Bld: 560 mg/dL (ref 70–99)
Potassium: 5 mEq/L (ref 3.7–5.3)
Sodium: 136 mEq/L — ABNORMAL LOW (ref 137–147)
Total Bilirubin: 1 mg/dL (ref 0.3–1.2)
Total Protein: 8.7 g/dL — ABNORMAL HIGH (ref 6.0–8.3)

## 2013-11-16 LAB — POCT I-STAT TROPONIN I: Troponin i, poc: 0 ng/mL (ref 0.00–0.08)

## 2013-11-16 LAB — URINE MICROSCOPIC-ADD ON

## 2013-11-16 LAB — POCT I-STAT 3, VENOUS BLOOD GAS (G3P V)
Acid-base deficit: 9 mmol/L — ABNORMAL HIGH (ref 0.0–2.0)
Bicarbonate: 18.9 mEq/L — ABNORMAL LOW (ref 20.0–24.0)
O2 Saturation: 56 %
TCO2: 20 mmol/L (ref 0–100)
pCO2, Ven: 48 mmHg (ref 45.0–50.0)
pH, Ven: 7.204 — ABNORMAL LOW (ref 7.250–7.300)
pO2, Ven: 36 mmHg (ref 30.0–45.0)

## 2013-11-16 LAB — CG4 I-STAT (LACTIC ACID): Lactic Acid, Venous: 3.59 mmol/L — ABNORMAL HIGH (ref 0.5–2.2)

## 2013-11-16 MED ORDER — SODIUM CHLORIDE 0.9 % IV SOLN
INTRAVENOUS | Status: DC
  Administered 2013-11-16: 4.9 [IU]/h via INTRAVENOUS
  Filled 2013-11-16: qty 1

## 2013-11-16 MED ORDER — SODIUM CHLORIDE 0.9 % IV SOLN
INTRAVENOUS | Status: DC
  Administered 2013-11-17: via INTRAVENOUS

## 2013-11-16 MED ORDER — DEXTROSE 50 % IV SOLN: 25.0000 mL | INTRAVENOUS | Status: DC | PRN

## 2013-11-16 MED ORDER — SODIUM CHLORIDE 0.9 % IV SOLN
INTRAVENOUS | Status: AC
  Filled 2013-11-16 (×2): qty 1

## 2013-11-16 MED ORDER — IRBESARTAN 150 MG PO TABS
150.0000 mg | ORAL_TABLET | Freq: Every day | ORAL | Status: DC
  Administered 2013-11-17 – 2013-11-18 (×2): 150 mg via ORAL
  Filled 2013-11-16 (×3): qty 1

## 2013-11-16 MED ORDER — VALSARTAN-HYDROCHLOROTHIAZIDE 160-25 MG PO TABS: 1.0000 | ORAL_TABLET | Freq: Every day | ORAL | Status: DC

## 2013-11-16 MED ORDER — DEXTROSE-NACL 5-0.45 % IV SOLN
INTRAVENOUS | Status: AC
  Administered 2013-11-17: 03:00:00 via INTRAVENOUS

## 2013-11-16 MED ORDER — SODIUM CHLORIDE 0.9 % IV BOLUS (SEPSIS)
2000.0000 mL | Freq: Once | INTRAVENOUS | Status: AC
  Administered 2013-11-16: 2000 mL via INTRAVENOUS

## 2013-11-16 MED ORDER — HYDROCHLOROTHIAZIDE 25 MG PO TABS
25.0000 mg | ORAL_TABLET | Freq: Every day | ORAL | Status: DC
  Administered 2013-11-17 – 2013-11-18 (×2): 25 mg via ORAL
  Filled 2013-11-16 (×2): qty 1

## 2013-11-16 MED ORDER — HEPARIN SODIUM (PORCINE) 5000 UNIT/ML IJ SOLN
5000.0000 [IU] | Freq: Three times a day (TID) | INTRAMUSCULAR | Status: DC
  Administered 2013-11-17 – 2013-11-19 (×8): 5000 [IU] via SUBCUTANEOUS
  Filled 2013-11-16 (×10): qty 1

## 2013-11-16 NOTE — ED Notes (Signed)
Pt reports he was diagnosed Monday with Type 2 Diabetes, pt states he has been feeling weak, decrease appetite, nausea, and vomiting since Monday, pt has had increase thirst, dry mouth, weight loss, and increase urination x3 weeks. Pt is taking medication but can not remember the name of the medication. Jill AlexandersJustin with pharmacy is following up with [facility] on [address]

## 2013-11-16 NOTE — Telephone Encounter (Signed)
Patient Information:  Caller Name: [place]  Phone: [phone]  Patient: Jaquelyn BitterWragg, Rucker  Gender: Male  DOB: 12/12/1970  Age: 43 Years  PCP: Gershon CraneFry, Stephen [facility](Family Practice)  Office Follow Up:  Does the office need to follow up with this patient?: Yes  Instructions For The Office: Samara DeistKathryn to report to Dr. Clent RidgesFry and call spouse back with a recommendation.  RN Note:  In addition to triage and message to [facility] regarding ED Disposition. The following was discussed (prior to triaging symptoms):  Spouse made aware of Dr. Claris CheFry's recommendation to decrease Metformin. Spouse states she has not heard anything regarding the Endocrinology appt with Dr. Chestine Sporelark and questions where we are at in the process of referral or if she is to make a phone call herself for the appointment. Please advise. Spouse also questions if any changes should be made to dosing of Onglyza. Advised this medication was not addressed in Dr. Claris CheFry's recommendation so therefore continue taking Onglyza as directed at last office visit until further notified. Please clarify this question with patient when discussing endocrinology appointment. THANK YOU.  Symptoms  Reason For Call & Symptoms: Continued vomiting on new meds for Diabetes control. Last blood sugar reading 381 this AM.  Reviewed Health History In EMR: Yes  Reviewed Medications In EMR: Yes  Reviewed Allergies In EMR: Yes  Reviewed Surgeries / Procedures: Yes  Date of Onset of Symptoms: 11/12/2013  Treatments Tried: Medication regimen prescribed by Dr. Abran CantorFrye  Treatments Tried Worked: No  Guideline(s) Used:  Diabetes - High Blood Sugar  Disposition Per Guideline:   Go to ED Now (or to Office with PCP Approval)  Reason For Disposition Reached:   Blood glucose > 240 mg/dl (13 mmol/l) AND vomiting AND unable to check urine ketones  Advice Given:  N/A  RN Overrode Recommendation:  Document Patient  Report called to Samara DeistKathryn who advises for patient to anticipate a phone call  with new recommendations from Dr. Clent RidgesFry. Samara DeistKathryn will report to Dr. Clent RidgesFry and call spouse back.

## 2013-11-16 NOTE — Telephone Encounter (Signed)
Patient Information:  Caller Name: [place]  Phone: [phone]  Patient: Craig Nguyen, Craig Nguyen  Gender: Male  DOB: 1971/01/21  Age: [age] Years  PCP: Gershon CraneFry, Stephen [facility](Family Practice)  Office Follow Up:  Does the office need to follow up with this patient?: No  Instructions For The Office: N/A   Symptoms  Reason For Call & Symptoms: Pts spouse is calling back. See earlier message from today. She had called about the pt vomiting since Wed. Pt cannot eat solids. He is able to take Glycerna drinks - he has 2/day and is drinking alot of water and ginger tea. Pt is urinating. His blood sugars remain high - this am it was 400 but pt has been checking it after a meal - no one explained that to her.. Pt was just diagnosed on Wed and is waiting for his consult to endocrinology.  Pt last urinated 1 hour ago but he reports that his mouth is really dry and he is very weak.   Reviewed Health History In EMR: Yes  Reviewed Medications In EMR: Yes  Reviewed Allergies In EMR: Yes  Reviewed Surgeries / Procedures: Yes  Date of Onset of Symptoms: 11/14/2013  Guideline(s) Used:  Vomiting  Diabetes - High Blood Sugar  Disposition Per Guideline:   Go to ED Now  Reason For Disposition Reached:   Vomiting and signs of dehydration (e.g., very dry mouth, lightheaded, etc.)  Advice Given:  N/A  Patient Will Follow Care Advice:  YES

## 2013-11-16 NOTE — Telephone Encounter (Signed)
I spoke with pt's wife and she is going to take pt to the ER. Dr. Clent RidgesFry will put in a referral for endocrinology, if it has not been done.

## 2013-11-16 NOTE — ED Notes (Signed)
Pt. Recently diagnosed with diabetes on Monday. Reports generalized weakness, N/V, weight loss, decreased appetite, increased thirst. Pt. Alert and oriented x4.

## 2013-11-16 NOTE — H&P (Signed)
[facility] History and Physical  Craig Nguyen TJQ:[id number] DOB: 1971-09-02 DOA: 11/16/2013  Referring physician: EDP PCP: Laurey Morale, MD   Chief Complaint: DKA   HPI: Craig Nguyen is a 43 y.o. male who was just diagnosed with new onset DM2 earlier this week.  He was initially started on medications, before being backed down on levels of these PO medications today due to his wife feeling that he was overmedicated.  Unfortunately, after reducing his DM medication doses today, he developed worsening generalized weakness, N/V, and so presented to the ED.  In the ED his BGL is 590 and he is in DKA.  His wife is suspicious that he may have the flu, but he denies fever, chills, body aches, respiratory complaints.  Review of Systems: Systems reviewed.  As above, otherwise negative  Past Medical History  Diagnosis Date  . Hypertension   . Thyroid nodule     right side & benign  . Diabetes mellitus without complication    Past Surgical History  Procedure Laterality Date  . Vasectomy    . Colonoscopy  08/05/08    Dr. Collene Mares, repeat at age 1   Social History:  reports that he has never smoked. He has never used smokeless tobacco. He reports that he drinks alcohol. He reports that he does not use illicit drugs.  Allergies  Allergen Reactions  . Aspirin Swelling    Family History  Problem Relation Age of Onset  . Diabetes    . Hypertension    . Prostate cancer    . Sarcoidosis      pulmonary, sister  . Diabetes Mother   . Hypertension Mother   . Diabetes Father   . Hypertension Father   . Diabetes Brother   . Hypertension Brother      Prior to Admission medications   Medication Sig Start Date End Date Taking? Authorizing Provider  Ascorbic Acid (VITAMIN C) 100 MG tablet Take 100 mg by mouth daily.   Yes Historical Provider, MD  Cyanocobalamin (VITAMIN B 12 PO) Take 1 tablet by mouth daily.    Yes Historical Provider, MD  glucose monitoring kit (FREESTYLE)  monitoring kit 1 each by Does not apply route as needed for other. Dispense appropriate test strips and lancets for twice daily use. 11/12/13  Yes Ellison Carwin, MD  metFORMIN (GLUCOPHAGE-XR) 500 MG 24 hr tablet Take 500 mg by mouth 2 (two) times daily.   Yes Historical Provider, MD  saxagliptin HCl (ONGLYZA) 2.5 MG TABS tablet Take 2.5 mg by mouth daily.   Yes Historical Provider, MD  valsartan-hydrochlorothiazide (DIOVAN-HCT) 160-25 MG per tablet Take 1 tablet by mouth daily. 09/17/13  Yes Laurey Morale, MD   Physical Exam: Filed Vitals:   11/16/13 2115  BP: 156/88  Pulse: 77  Temp:   Resp: 13    BP 156/88  Pulse 77  Temp(Src) 98.1 F (36.7 C) (Oral)  Resp 13  Wt 91.343 kg (201 lb 6 oz)  SpO2 100%  General Appearance:    Alert, oriented, no distress, appears stated age  Head:    Normocephalic, atraumatic  Eyes:    PERRL, EOMI, sclera non-icteric        Nose:   Nares without drainage or epistaxis. Mucosa, turbinates normal  Throat:   Moist mucous membranes. Oropharynx without erythema or exudate.  Neck:   Supple. No carotid bruits.  No thyromegaly.  No lymphadenopathy.   Back:     No CVA tenderness, no spinal tenderness  Lungs:     Clear to auscultation bilaterally, without wheezes, rhonchi or rales  Chest wall:    No tenderness to palpitation  Heart:    Regular rate and rhythm without murmurs, gallops, rubs  Abdomen:     Soft, non-tender, nondistended, normal bowel sounds, no organomegaly  Genitalia:    deferred  Rectal:    deferred  Extremities:   No clubbing, cyanosis or edema.  Pulses:   2+ and symmetric all extremities  Skin:   Skin color, texture, turgor normal, no rashes or lesions  Lymph nodes:   Cervical, supraclavicular, and axillary nodes normal  Neurologic:   CNII-XII intact. Normal strength, sensation and reflexes      throughout    Labs on Admission:  Basic Metabolic Panel:  Recent Labs Lab 11/12/13 1504 11/16/13 1900  NA 131* 136*  K 4.5 5.0  CL  90* 87*  CO2 23 16*  GLUCOSE 425* 560*  BUN 25* 34*  CREATININE 1.24 1.35  CALCIUM 10.3 9.8   Liver Function Tests:  Recent Labs Lab 11/12/13 1504 11/16/13 1900  AST 18 17  ALT 16 10  ALKPHOS 107 101  BILITOT 1.1 1.0  PROT 8.0 8.7*  ALBUMIN 4.6 4.3    Recent Labs Lab 11/12/13 1504  LIPASE 60  AMYLASE 52   No results found for this basename: AMMONIA,  in the last 168 hours CBC:  Recent Labs Lab 11/12/13 1519 11/16/13 1900  WBC 12.8* 16.7*  NEUTROABS  --  15.1*  HGB 15.8 16.5  HCT 48.8 45.4  MCV 89.9 82.7  PLT  --  453*   Cardiac Enzymes: No results found for this basename: CKTOTAL, CKMB, CKMBINDEX, TROPONINI,  in the last 168 hours  BNP (last 3 results) No results found for this basename: PROBNP,  in the last 8760 hours CBG:  Recent Labs Lab 11/16/13 1854 11/16/13 1913 11/16/13 2057  GLUCAP 500* 545* 545*    Radiological Exams on Admission: No results found.  EKG: Independently reviewed.  Assessment/Plan Active Problems:   DKA (diabetic ketoacidoses)   1. DKA - on DKA protocol.  Admit to SDU, BMPs per protocol, insulin gtt.    Code Status: Full Code  Family Communication: Wife at bedside Disposition Plan: Admit to inpatient   Time spent: 30 min  GARDNER, JARED M. [facility] Pager [phone]  If 7AM-7PM, please contact the day team taking care of the patient [email or web address] Password [email or web address] 11/16/2013, 10:01 PM

## 2013-11-16 NOTE — Telephone Encounter (Signed)
I left a voice message, per Dr. Clent RidgesFry pt should reduce the Onglyza to one tablet once a day and the Metformin one tablet twice a day, also advised pt to call Dr. Ophelia Charterlark's office and ask about the appointment?

## 2013-11-16 NOTE — ED Provider Notes (Signed)
CSN: [id number]     Arrival date & time 11/16/13  1826 History   First MD Initiated Contact with Patient 11/16/13 2010     Chief Complaint  Patient presents with  . Hyperglycemia  . Weakness   (Consider location/radiation/quality/duration/timing/severity/associated sxs/prior Treatment) HPI  This a 43 year old male with a history of hypertension and recent diagnosis of diabetes who presents with generalized weakness, weight loss, nausea, vomiting, polydipsia, and polyuria. Patient was started on metformin earlier this week for hyperglycemia. He was subsequently seen by his primary Dr. who started a second oral hyperglycemic agent.  Patient has had vomiting for the last 2 days. He denies abdominal pain or diarrhea. He denies any chest pain. In triage patient's glucose to be greater than 500.  Past Medical History  Diagnosis Date  . Hypertension   . Thyroid nodule     right side & benign  . Diabetes mellitus without complication    Past Surgical History  Procedure Laterality Date  . Vasectomy    . Colonoscopy  08/05/08    Dr. Collene Mares, repeat at age 41   Family History  Problem Relation Age of Onset  . Diabetes    . Hypertension    . Prostate cancer    . Sarcoidosis      pulmonary, sister  . Diabetes Mother   . Hypertension Mother   . Diabetes Father   . Hypertension Father   . Diabetes Brother   . Hypertension Brother    History  Substance Use Topics  . Smoking status: Never Smoker   . Smokeless tobacco: Never Used  . Alcohol Use: 0.0 oz/week     Comment: occ    Review of Systems  Constitutional: Negative for fever.  Respiratory: Negative.  Negative for chest tightness and shortness of breath.   Cardiovascular: Negative.  Negative for chest pain.  Gastrointestinal: Positive for nausea and vomiting. Negative for abdominal pain.  Endocrine: Positive for polydipsia and polyuria.  Genitourinary: Negative.  Negative for dysuria.  Musculoskeletal: Negative for back pain.   Skin: Negative for rash.  Neurological: Positive for weakness. Negative for headaches.  All other systems reviewed and are negative.    Allergies  Aspirin  Home Medications   Current Outpatient Rx  Name  Route  Sig  Dispense  Refill  . Ascorbic Acid (VITAMIN C) 100 MG tablet   Oral   Take 100 mg by mouth daily.         . Cyanocobalamin (VITAMIN B 12 PO)   Oral   Take 1 tablet by mouth daily.          Marland Kitchen glucose monitoring kit (FREESTYLE) monitoring kit   Does not apply   1 each by Does not apply route as needed for other. Dispense appropriate test strips and lancets for twice daily use.   1 each   12   . metFORMIN (GLUCOPHAGE-XR) 500 MG 24 hr tablet   Oral   Take 500 mg by mouth 2 (two) times daily.         . saxagliptin HCl (ONGLYZA) 2.5 MG TABS tablet   Oral   Take 2.5 mg by mouth daily.         . valsartan-hydrochlorothiazide (DIOVAN-HCT) 160-25 MG per tablet   Oral   Take 1 tablet by mouth daily.   90 tablet   3    BP 156/88  Pulse 77  Temp(Src) 98.1 F (36.7 C) (Oral)  Resp 13  Wt 201 lb 6 oz (  91.343 kg)  SpO2 100% Physical Exam  Nursing note and vitals reviewed. Constitutional: He is oriented to person, place, and time. No distress.  HENT:  Head: Normocephalic and atraumatic.  Mucous membranes dry  Eyes: Pupils are equal, round, and reactive to light.  Neck: Neck supple.  Cardiovascular: Regular rhythm and normal heart sounds.   No murmur heard. Tachycardia  Pulmonary/Chest: Effort normal and breath sounds normal. No respiratory distress. He has no wheezes.  Abdominal: Soft. Bowel sounds are normal. There is no tenderness. There is no rebound.  Musculoskeletal: He exhibits no edema.  Lymphadenopathy:    He has no cervical adenopathy.  Neurological: He is alert and oriented to person, place, and time.  Skin: Skin is warm and dry.  Psychiatric: He has a normal mood and affect.    ED Course  Procedures (including critical care  time) Labs Review Labs Reviewed  CBC WITH DIFFERENTIAL - Abnormal; Notable for the following:    WBC 16.7 (*)    MCHC 36.3 (*)    Platelets 453 (*)    Neutrophils Relative % 91 (*)    Neutro Abs 15.1 (*)    Lymphocytes Relative 7 (*)    All other components within normal limits  COMPREHENSIVE METABOLIC PANEL - Abnormal; Notable for the following:    Sodium 136 (*)    Chloride 87 (*)    CO2 16 (*)    Glucose, Bld 560 (*)    BUN 34 (*)    Total Protein 8.7 (*)    GFR calc non Af Amer 62 (*)    GFR calc Af Amer 72 (*)    All other components within normal limits  URINALYSIS, ROUTINE W REFLEX MICROSCOPIC - Abnormal; Notable for the following:    Glucose, UA >1000 (*)    Ketones, ur 40 (*)    All other components within normal limits  GLUCOSE, CAPILLARY - Abnormal; Notable for the following:    Glucose-Capillary 500 (*)    All other components within normal limits  GLUCOSE, CAPILLARY - Abnormal; Notable for the following:    Glucose-Capillary 545 (*)    All other components within normal limits  URINE MICROSCOPIC-ADD ON - Abnormal; Notable for the following:    Casts HYALINE CASTS (*)    All other components within normal limits  GLUCOSE, CAPILLARY - Abnormal; Notable for the following:    Glucose-Capillary 545 (*)    All other components within normal limits  POCT I-STAT 3, BLOOD GAS (G3P V) - Abnormal; Notable for the following:    pH, Ven 7.204 (*)    Bicarbonate 18.9 (*)    Acid-base deficit 9.0 (*)    All other components within normal limits  CG4 I-STAT (LACTIC ACID) - Abnormal; Notable for the following:    Lactic Acid, Venous 3.59 (*)    All other components within normal limits  POCT I-STAT TROPONIN I   Imaging Review No results found.  EKG Interpretation    Date/Time:  Friday November 16 2013 20:53:54 EST Ventricular Rate:  77 PR Interval:  134 QRS Duration: 76 QT Interval:  453 QTC Calculation: 513 R Axis:   33 Text Interpretation:  Sinus rhythm  Prolonged QT interval no prior for comparison Confirmed by HORTON  MD, Loma Sousa ([id number]) on 11/16/2013 8:57:31 PM           CRITICAL CARE Performed by: Thayer Jew, F   Total critical care time: 35 min  Critical care time was exclusive of separately billable procedures  and treating other patients.  Critical care was necessary to treat or prevent imminent or life-threatening deterioration.  Critical care was time spent personally by me on the following activities: development of treatment plan with patient and/or surrogate as well as nursing, discussions with consultants, evaluation of patient's response to treatment, examination of patient, obtaining history from patient or surrogate, ordering and performing treatments and interventions, ordering and review of laboratory studies, ordering and review of radiographic studies, pulse oximetry and re-evaluation of patient's condition.  MDM   1. Diabetic ketoacidosis     Patient presents with hyperglycemia. He has had new onset diabetes and currently only on oral hyperglycemic medications. Lab work shows an anion gap with pH of 7.2. Patient was given 2 L of fluid and started on glucose stabilizer. EKG is reassuring and troponin is negative.  She does have a mild lactic acidosis. He will be admitted for further glucose control.   Merryl Hacker, MD 11/16/13 2151

## 2013-11-16 NOTE — Telephone Encounter (Signed)
I agree that some of the nausea may be due to the meds. Tell him to decrease the Metformin from 2 pills twice a day to just one pill twice a day. Ask them if they have heard about the Endocrinology appt yet (with Dr. Chestine Sporelark?)

## 2013-11-16 NOTE — ED Notes (Addendum)
Dr. Wilkie AyeHorton made aware of CG-4 and VBG  result

## 2013-11-17 LAB — LIPID PANEL
Cholesterol: 175 mg/dL (ref 0–200)
HDL: 51 mg/dL (ref >39)
LDL Cholesterol: 110 mg/dL — ABNORMAL HIGH (ref 0–99)
Total CHOL/HDL Ratio: 3.4 RATIO
Triglycerides: 68 mg/dL (ref <150)
VLDL: 14 mg/dL (ref 0–40)

## 2013-11-17 LAB — BASIC METABOLIC PANEL
BUN: 31 mg/dL — ABNORMAL HIGH (ref 6–23)
BUN: 29 mg/dL — ABNORMAL HIGH (ref 6–23)
BUN: 24 mg/dL — ABNORMAL HIGH (ref 6–23)
CO2: 21 mEq/L (ref 19–32)
CO2: 27 mEq/L (ref 19–32)
CO2: 27 mEq/L (ref 19–32)
Calcium: 10.1 mg/dL (ref 8.4–10.5)
Calcium: 10.1 mg/dL (ref 8.4–10.5)
Calcium: 9.4 mg/dL (ref 8.4–10.5)
Chloride: 98 mEq/L (ref 96–112)
Chloride: 104 mEq/L (ref 96–112)
Chloride: 107 mEq/L (ref 96–112)
Creatinine, Ser: 1.19 mg/dL (ref 0.50–1.35)
Creatinine, Ser: 1.09 mg/dL (ref 0.50–1.35)
Creatinine, Ser: 1.05 mg/dL (ref 0.50–1.35)
GFR calc Af Amer: 84 mL/min — ABNORMAL LOW (ref >90)
GFR calc Af Amer: >90 mL/min (ref >90)
GFR calc Af Amer: >90 mL/min (ref >90)
GFR calc non Af Amer: 73 mL/min — ABNORMAL LOW (ref >90)
GFR calc non Af Amer: 81 mL/min — ABNORMAL LOW (ref >90)
GFR calc non Af Amer: 85 mL/min — ABNORMAL LOW (ref >90)
Glucose, Bld: 336 mg/dL — ABNORMAL HIGH (ref 70–99)
Glucose, Bld: 237 mg/dL — ABNORMAL HIGH (ref 70–99)
Glucose, Bld: 134 mg/dL — ABNORMAL HIGH (ref 70–99)
Potassium: 4 mEq/L (ref 3.7–5.3)
Potassium: 4.2 mEq/L (ref 3.7–5.3)
Potassium: 3.6 mEq/L — ABNORMAL LOW (ref 3.7–5.3)
Sodium: 140 mEq/L (ref 137–147)
Sodium: 145 mEq/L (ref 137–147)
Sodium: 145 mEq/L (ref 137–147)

## 2013-11-17 LAB — GLUCOSE, CAPILLARY
Glucose-Capillary: 124 mg/dL — ABNORMAL HIGH (ref 70–99)
Glucose-Capillary: 138 mg/dL — ABNORMAL HIGH (ref 70–99)
Glucose-Capillary: 149 mg/dL — ABNORMAL HIGH (ref 70–99)
Glucose-Capillary: 157 mg/dL — ABNORMAL HIGH (ref 70–99)
Glucose-Capillary: 192 mg/dL — ABNORMAL HIGH (ref 70–99)
Glucose-Capillary: 204 mg/dL — ABNORMAL HIGH (ref 70–99)
Glucose-Capillary: 204 mg/dL — ABNORMAL HIGH (ref 70–99)
Glucose-Capillary: 207 mg/dL — ABNORMAL HIGH (ref 70–99)
Glucose-Capillary: 212 mg/dL — ABNORMAL HIGH (ref 70–99)
Glucose-Capillary: 213 mg/dL — ABNORMAL HIGH (ref 70–99)
Glucose-Capillary: 223 mg/dL — ABNORMAL HIGH (ref 70–99)
Glucose-Capillary: 275 mg/dL — ABNORMAL HIGH (ref 70–99)
Glucose-Capillary: 305 mg/dL — ABNORMAL HIGH (ref 70–99)
Glucose-Capillary: 318 mg/dL — ABNORMAL HIGH (ref 70–99)
Glucose-Capillary: 93 mg/dL (ref 70–99)
Glucose-Capillary: 95 mg/dL (ref 70–99)

## 2013-11-17 LAB — MRSA PCR SCREENING: MRSA by PCR: NEGATIVE

## 2013-11-17 MED ORDER — INSULIN GLARGINE 100 UNIT/ML ~~LOC~~ SOLN
20.0000 [IU] | Freq: Once | SUBCUTANEOUS | Status: AC
  Administered 2013-11-17: 20 [IU] via SUBCUTANEOUS
  Filled 2013-11-17: qty 0.2

## 2013-11-17 MED ORDER — IRBESARTAN 150 MG PO TABS
150.0000 mg | ORAL_TABLET | Freq: Once | ORAL | Status: AC
  Administered 2013-11-17: 150 mg via ORAL

## 2013-11-17 MED ORDER — INSULIN GLARGINE 100 UNIT/ML ~~LOC~~ SOLN
20.0000 [IU] | Freq: Every day | SUBCUTANEOUS | Status: DC
  Administered 2013-11-17: 20 [IU] via SUBCUTANEOUS
  Filled 2013-11-17 (×2): qty 0.2

## 2013-11-17 MED ORDER — SODIUM CHLORIDE 0.9 % IV SOLN
INTRAVENOUS | Status: DC
  Administered 2013-11-17: 13:00:00 via INTRAVENOUS
  Administered 2013-11-18: 75 mL/h via INTRAVENOUS

## 2013-11-17 MED ORDER — INSULIN ASPART 100 UNIT/ML ~~LOC~~ SOLN
6.0000 [IU] | Freq: Three times a day (TID) | SUBCUTANEOUS | Status: DC
  Administered 2013-11-18: 6 [IU] via SUBCUTANEOUS

## 2013-11-17 MED ORDER — GLUCERNA SHAKE PO LIQD: 237.0000 mL | Freq: Two times a day (BID) | ORAL | Status: DC

## 2013-11-17 MED ORDER — INSULIN ASPART 100 UNIT/ML ~~LOC~~ SOLN
3.0000 [IU] | Freq: Three times a day (TID) | SUBCUTANEOUS | Status: DC
  Administered 2013-11-17: 3 [IU] via SUBCUTANEOUS

## 2013-11-17 MED ORDER — INSULIN GLARGINE 100 UNIT/ML ~~LOC~~ SOLN
14.0000 [IU] | Freq: Every day | SUBCUTANEOUS | Status: DC
  Filled 2013-11-17: qty 0.14

## 2013-11-17 MED ORDER — INSULIN ASPART 100 UNIT/ML ~~LOC~~ SOLN
0.0000 [IU] | Freq: Three times a day (TID) | SUBCUTANEOUS | Status: DC
  Administered 2013-11-17: 15 [IU] via SUBCUTANEOUS
  Administered 2013-11-18 (×3): 7 [IU] via SUBCUTANEOUS
  Administered 2013-11-19 (×2): 4 [IU] via SUBCUTANEOUS

## 2013-11-17 NOTE — Progress Notes (Signed)
INITIAL NUTRITION ASSESSMENT  DOCUMENTATION CODES Per approved criteria  -Not Applicable   INTERVENTION: Provide Glucerna Shakes BID until po intake improves Provide diabetes diet education; provided "Carbodydrate Counting for Diabetes" handout from the Academy of [employer] and Dietetics  NUTRITION DIAGNOSIS: Unintentional weight loss related to uncontrolled/undiagnosed diabetes as evidenced by 13% weight loss in 2 months.   Goal: Pt to meet >/= 90% of their estimated nutrition needs   Monitor:  PO intake Weights Labs  Reason for Assessment: MST/ Consult for diabetes EDU  43 y.o. male  Admitting Dx: <principal problem not specified>  ASSESSMENT: 43 y.o. male who was just diagnosed with new onset DM2 earlier this week. He was initially started on medications, before being backed down on levels of these PO medications today due to his wife feeling that he was overmedicated. Unfortunately, after reducing his DM medication doses today, he developed worsening generalized weakness, N/V, and so presented to the ED.  RD consulted for nutrition education regarding diabetes.   Lab Results  Component Value Date   HGBA1C 11.7 11/12/2013    RD provided "Carbohydrate Counting for People with Diabetes" handout from the Academy of [employer] and Dietetics. Discussed different food groups and their effects on blood sugar, emphasizing carbohydrate-containing foods. Provided list of carbohydrates and recommended serving sizes of common foods.  Discussed importance of controlled and consistent carbohydrate intake throughout the day. Provided examples of ways to balance meals/snacks and encouraged intake of high-fiber, whole grain complex carbohydrates. Teach back method used. Pt reports drinking sweet tea and Gatorade PTA due to excessive thirst and dehydration. Pt states he will switch to sugar-free beverages and he will start eating more whole grains. He will aim to eat 3 meals and one snack  daily.  Expect good compliance.  Pt reports that he weighed 230 lbs mid November and has lost weight due to poor appetite. He states his appetite is still poor today and he has only been eating a few bites at meals. He states his wife bought him Glucerna Shakes PTA due to poor appetite and weight loss.    Height: Ht Readings from Last 1 Encounters:  11/17/13 6' (1.829 m)    Weight: Wt Readings from Last 1 Encounters:  11/17/13 201 lb 4.5 oz (91.3 kg)    Ideal Body Weight: 178 lbs  % Ideal Body Weight: 113%  Wt Readings from Last 10 Encounters:  11/17/13 201 lb 4.5 oz (91.3 kg)  11/13/13 213 lb (96.616 kg)  11/12/13 210 lb 3.2 oz (95.346 kg)  09/17/13 236 lb (107.049 kg)  08/16/12 229 lb (103.874 kg)  10/20/10 228 lb (103.42 kg)  10/13/09 231 lb (104.781 kg)  07/17/09 229 lb (103.874 kg)  04/25/09 229 lb (103.874 kg)  04/14/09 232 lb (105.235 kg)    Usual Body Weight: 230 lbs  % Usual Body Weight: 87%  BMI:  Body mass index is 27.29 kg/(m^2).  Estimated Nutritional Needs: Kcal: 2200-2400 Protein: 95-110 grams Fluid: 2.4-2.6 L/day  Skin: WDL  Diet Order: Carb Control  EDUCATION NEEDS: -Education needs addressed   Intake/Output Summary (Last 24 hours) at 11/17/13 1420 Last data filed at 11/17/13 1400  Gross per 24 hour  Intake 843.79 ml  Output   1100 ml  Net -256.21 ml    Last BM: PTA   Labs:   Recent Labs Lab 11/16/13 2200 11/17/13 0250 11/17/13 0850  NA 141 145 145  K 4.6 4.2 3.6*  CL 97 104 107  CO2 18* 27 27  BUN 33* 29* 24*  CREATININE 1.25 1.09 1.05  CALCIUM 9.2 10.1 9.4  GLUCOSE 437* 237* 134*    CBG (last 3)   Recent Labs  11/17/13 1113 11/17/13 1224 11/17/13 1313  GLUCAP 157* 204* 207*    Scheduled Meds: . heparin  5,000 Units Subcutaneous Q8H  . irbesartan  150 mg Oral Daily   And  . hydrochlorothiazide  25 mg Oral Daily  . insulin aspart  0-20 Units Subcutaneous TID WC  . insulin aspart  3 Units Subcutaneous TID  WC  . insulin glargine  14 Units Subcutaneous QHS    Continuous Infusions: . sodium chloride 75 mL/hr at 11/17/13 1239    Past Medical History  Diagnosis Date  . Hypertension   . Thyroid nodule     right side & benign  . Diabetes mellitus without complication     Past Surgical History  Procedure Laterality Date  . Vasectomy    . Colonoscopy  08/05/08    Dr. Loreta Ave, repeat at age 39    Ian Malkin RD, LDN Inpatient Clinical Dietitian Pager: [phone] After Hours Pager: [phone]

## 2013-11-17 NOTE — Progress Notes (Signed)
[facility] Progress Note [place] TEAM 1 - Stepdown/ICU TEAM   Raina MinaLeonard G Cornelio YQM:[id number] DOB: September 13, 1971 DOA: 11/16/2013 PCP: Nelwyn SalisburyFRY,STEPHEN A, MD  Admit HPI / Brief Narrative: 43 y.o. male who was diagnosed with new onset DM2 earlier this week. He was initially started on medications, before being backed down on levels of these PO medications due to his wife feeling that he was overmedicated. Unfortunately, after reducing his DM medication doses he developed worsening generalized weakness, N/V, and so presented to the ED.   In the ED his BGL was 590 and he was in DKA.   HPI/Subjective: Pt feels much better.  He has no complaints at present.  I have spent 1hr discussing the DM disease process, treatment options, DM1 vs/ DM2, differentiating between the two, DKA and its implications, etc.    Assessment/Plan:  DKA in newly diagnosed uncontrolled DM2 Gap has rapidly closed and bicarb has normalized - CBGs well controlled at this time - transition off insulin gtt - watch for rapid relapse - given recent signif weight loss and presentation in DKA (catabolic sx) I feel this pt will need insulin tx - he is very much interested in determining if this is type 1 or type 2, and is very hopeful that he will eventually be able to go back to oral meds alone - I will check GAD and islet cell antibodies to attempt to make a formal diagnosis of type 1, as this will necessitate life long insulin tx - diabetic education consult - nutrition consult - need to monitor for 24-48hrs more to assure CBG controlled and pt remains out of DKA - may require all of next week for monitoring at home 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)  HTN BP poorly controlled - renal function normal - adjust tx plan and follow   ASA allergy Pt reports actual hives and generalized swelling w/ aspirin - therefore, he is felt to be at risk for anaphylaxis and should avoid ASA -  have suggested that future allergy testing and potential desensitization may prove to be beneficial given his new diagnosis of DM   Code Status: FULL Family Communication: spoke at length w/ pt and wife at bedside  Disposition Plan: transfer to med bed when off insulin gtt  Consultants: none  Procedures: none  Antibiotics: none  DVT prophylaxis: SQ heparin   Objective: Blood pressure 148/96, pulse 74, temperature 98 F (36.7 C), temperature source Oral, resp. rate 16, height 6' (1.829 m), weight 91.3 kg (201 lb 4.5 oz), SpO2 98.00%.  Intake/Output Summary (Last 24 hours) at 11/17/13 1118 Last data filed at 11/17/13 0800  Gross per 24 hour  Intake 742.54 ml  Output   1100 ml  Net -357.46 ml   Exam: General: No acute respiratory distress Lungs: Clear to auscultation bilaterally without wheezes or crackles Cardiovascular: Regular rate and rhythm without murmur gallop or rub normal S1 and S2 Abdomen: Nontender, nondistended, soft, bowel sounds positive, no rebound, no ascites, no appreciable mass Extremities: No significant cyanosis, clubbing, or edema bilateral lower extremities  Data Reviewed: Basic Metabolic Panel:  Recent Labs Lab 11/16/13 0029 11/16/13 1900 11/16/13 2200 11/17/13 0250 11/17/13 0850  NA 140 136* 141 145 145  K 4.0 5.0 4.6 4.2 3.6*  CL 98 87* 97 104 107  CO2 21 16* 18* 27 27  GLUCOSE 336* 560* 437* 237* 134*  BUN 31* 34* 33* 29* 24*  CREATININE 1.19 1.35  1.25 1.09 1.05  CALCIUM 10.1 9.8 9.2 10.1 9.4   Liver Function Tests:  Recent Labs Lab 11/12/13 1504 11/16/13 1900  AST 18 17  ALT 16 10  ALKPHOS 107 101  BILITOT 1.1 1.0  PROT 8.0 8.7*  ALBUMIN 4.6 4.3    Recent Labs Lab 11/12/13 1504  LIPASE 60  AMYLASE 52   CBC:  Recent Labs Lab 11/12/13 1519 11/16/13 1900  WBC 12.8* 16.7*  NEUTROABS  --  15.1*  HGB 15.8 16.5  HCT 48.8 45.4  MCV 89.9 82.7  PLT  --  453*   CBG:  Recent Labs Lab 11/17/13 0644 11/17/13 0752  11/17/13 0902 11/17/13 1007 11/17/13 1113  GLUCAP 95 93 124* 138* 157*    Recent Results (from the past 240 hour(s))  MRSA PCR SCREENING     Status: None   Collection Time    11/16/13 11:46 PM      Result Value Range Status   MRSA by PCR NEGATIVE  NEGATIVE Final   Comment:            The GeneXpert MRSA Assay (FDA     approved for NASAL specimens     only), is one component of a     comprehensive MRSA colonization     surveillance program. It is not     intended to diagnose MRSA     infection nor to guide or     monitor treatment for     MRSA infections.     Studies:  Recent x-ray studies have been reviewed in detail by the Attending Physician  Scheduled Meds:  Scheduled Meds: . heparin  5,000 Units Subcutaneous Q8H  . irbesartan  150 mg Oral Daily   And  . hydrochlorothiazide  25 mg Oral Daily   Time spent on care of this patient: 35 mins  [facility] T  [facility] Office  [phone] Pager - Text Page per Loretha Stapler as per below:  On-Call/Text Page:      [email or web address]      password [email or web address]  If 7PM-7AM, please contact night-coverage [email or web address] Password [email or web address] 11/17/2013, 11:18 AM   LOS: 1 day

## 2013-11-18 LAB — GLUCOSE, CAPILLARY
Glucose-Capillary: 188 mg/dL — ABNORMAL HIGH (ref 70–99)
Glucose-Capillary: 227 mg/dL — ABNORMAL HIGH (ref 70–99)
Glucose-Capillary: 244 mg/dL — ABNORMAL HIGH (ref 70–99)
Glucose-Capillary: 73 mg/dL (ref 70–99)
Glucose-Capillary: 75 mg/dL (ref 70–99)

## 2013-11-18 LAB — BASIC METABOLIC PANEL
BUN: 17 mg/dL (ref 6–23)
CO2: 27 mEq/L (ref 19–32)
Calcium: 8.6 mg/dL (ref 8.4–10.5)
Chloride: 100 mEq/L (ref 96–112)
Creatinine, Ser: 1.1 mg/dL (ref 0.50–1.35)
GFR calc Af Amer: >90 mL/min (ref >90)
GFR calc non Af Amer: 80 mL/min — ABNORMAL LOW (ref >90)
Glucose, Bld: 226 mg/dL — ABNORMAL HIGH (ref 70–99)
Potassium: 3.3 mEq/L — ABNORMAL LOW (ref 3.7–5.3)
Sodium: 140 mEq/L (ref 137–147)

## 2013-11-18 MED ORDER — IRBESARTAN 300 MG PO TABS
300.0000 mg | ORAL_TABLET | Freq: Every day | ORAL | Status: DC
  Administered 2013-11-19: 300 mg via ORAL
  Filled 2013-11-18: qty 1

## 2013-11-18 MED ORDER — INSULIN ASPART 100 UNIT/ML ~~LOC~~ SOLN
10.0000 [IU] | Freq: Three times a day (TID) | SUBCUTANEOUS | Status: DC
  Administered 2013-11-18 – 2013-11-19 (×3): 10 [IU] via SUBCUTANEOUS

## 2013-11-18 MED ORDER — WHITE PETROLATUM GEL
Status: AC
  Filled 2013-11-18: qty 5

## 2013-11-18 MED ORDER — INSULIN GLARGINE 100 UNIT/ML ~~LOC~~ SOLN
26.0000 [IU] | Freq: Every day | SUBCUTANEOUS | Status: DC
  Administered 2013-11-19: 13 [IU] via SUBCUTANEOUS
  Filled 2013-11-18 (×2): qty 0.26

## 2013-11-18 MED ORDER — POTASSIUM CHLORIDE CRYS ER 20 MEQ PO TBCR
40.0000 meq | EXTENDED_RELEASE_TABLET | Freq: Once | ORAL | Status: AC
  Administered 2013-11-18: 40 meq via ORAL
  Filled 2013-11-18: qty 2

## 2013-11-18 MED ORDER — HYDROCHLOROTHIAZIDE 25 MG PO TABS
25.0000 mg | ORAL_TABLET | Freq: Every day | ORAL | Status: DC
  Administered 2013-11-19: 25 mg via ORAL
  Filled 2013-11-18: qty 1

## 2013-11-18 NOTE — Progress Notes (Signed)
Pts wife states she and pt have watched all the diabetic ed videos on [place] channel Doren CustardBeverly, Heidi Maclin D

## 2013-11-18 NOTE — Progress Notes (Signed)
[facility] Progress Note Craig Nguyen   Craig Nguyen YQI:[id number] DOB: 1971-01-05 DOA: 11/16/2013 PCP: Nelwyn Salisbury, MD  Admit HPI / Brief Narrative: 43 y.o. male who was diagnosed with new onset DM2 earlier this week. He was initially started on medications, before being backed down on levels of these PO medications due to his wife feeling that he was overmedicated. Unfortunately, after reducing his DM medication doses he developed worsening generalized weakness, N/V, and so presented to the ED.   In the ED his BGL was 590 and he was in DKA.   HPI/Subjective: Resting comfortably in bedside chair.  No specific complaints at this time.  Has been watching diabetes education videos on TV.  States that he feels "100% better than yesterday."  Assessment/Plan:  DKA in newly diagnosed uncontrolled DM2 Gap rapidly closed and bicarb normalized - CBGs remain above target range - has not relapsed into DKA off gtt - given recent signif weight loss (catabolic sx) and presentation in DKA I feel this pt will need insulin tx - he is very much interested in determining if this is type 1 or type 2, and is very hopeful that he will eventually be able to go back to oral meds alone - I have ordered GAD and islet cell antibodies to attempt to make a formal diagnosis of type 1, as this will necessitate life long insulin tx - diabetic education consult - nutrition consult has been completed- need to monitor for 24hrs more to assure CBG reasonably controlled and pt remains out of DKA - may require all of next week for monitoring at home 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)  HTN BP poorly controlled - renal function normal - adjust tx plan and follow   Mild Hypokalemia Replace and follow - partially due to tx of hyperglycemia  ASA allergy Pt reports actual hives and generalized swelling w/ aspirin - therefore, he is felt to be  at risk for anaphylaxis and should avoid ASA - have suggested that future allergy testing and potential desensitization may prove to be beneficial given his new diagnosis of DM   Code Status: FULL Family Communication: pt only  Disposition Plan: probable d/c home 1/12 if CBG controlled and DM education completed   Consultants: none  Procedures: none  Antibiotics: none  DVT prophylaxis: SQ heparin   Objective: Blood pressure 148/96, pulse 70, temperature 97.5 F (36.4 C), temperature source Oral, resp. rate 16, height 6' (1.829 m), weight 91.3 kg (201 lb 4.5 oz), SpO2 100.00%.  Intake/Output Summary (Last 24 hours) at 11/18/13 1327 Last data filed at 11/18/13 0800  Gross per 24 hour  Intake 1533.75 ml  Output    600 ml  Net 933.75 ml   Exam: General: No acute respiratory distress Lungs: Clear to auscultation bilaterally without wheezes or crackles Cardiovascular: Regular rate and rhythm without murmur gallop or rub  Abdomen: Nontender, nondistended, soft, bowel sounds positive, no rebound, no ascites, no appreciable mass Extremities: No significant cyanosis, clubbing, or edema bilateral lower extremities  Data Reviewed: Basic Metabolic Panel:  Recent Labs Lab 11/16/13 1900 11/16/13 2200 11/17/13 0250 11/17/13 0850 11/18/13 0530  NA 136* 141 145 145 140  K 5.0 4.6 4.2 3.6* 3.3*  CL 87* 97 104 107 100  CO2 16* 18* 27 27 27   GLUCOSE 560* 437* 237* 134* 226*  BUN 34* 33* 29* 24* 17  CREATININE 1.35 1.25 1.09  1.05 1.10  CALCIUM 9.8 9.2 10.1 9.4 8.6   Liver Function Tests:  Recent Labs Lab 11/12/13 1504 11/16/13 1900  AST 18 17  ALT 16 10  ALKPHOS 107 101  BILITOT 1.1 1.0  PROT 8.0 8.7*  ALBUMIN 4.6 4.3    Recent Labs Lab 11/12/13 1504  LIPASE 60  AMYLASE 52   CBC:  Recent Labs Lab 11/12/13 1519 11/16/13 1900  WBC 12.8* 16.7*  NEUTROABS  --  15.1*  HGB 15.8 16.5  HCT 48.8 45.4  MCV 89.9 82.7  PLT  --  453*   CBG:  Recent Labs Lab  11/17/13 1636 11/17/13 2026 11/17/13 2350 11/18/13 0659 11/18/13 1112  GLUCAP 305* 212* 223* 244* 227*    Recent Results (from the past 240 hour(s))  MRSA PCR SCREENING     Status: None   Collection Time    11/16/13 11:46 PM      Result Value Range Status   MRSA by PCR NEGATIVE  NEGATIVE Final   Comment:            The GeneXpert MRSA Assay (FDA     approved for NASAL specimens     only), is one component of a     comprehensive MRSA colonization     surveillance program. It is not     intended to diagnose MRSA     infection nor to guide or     monitor treatment for     MRSA infections.     Studies:  Recent x-ray studies have been reviewed in detail by the Attending Physician  Scheduled Meds:  Scheduled Meds: . feeding supplement (GLUCERNA SHAKE)  237 mL Oral BID PC  . heparin  5,000 Units Subcutaneous Q8H  . irbesartan  150 mg Oral Daily   And  . hydrochlorothiazide  25 mg Oral Daily  . insulin aspart  0-20 Units Subcutaneous TID WC  . insulin aspart  10 Units Subcutaneous TID WC  . insulin glargine  26 Units Subcutaneous QHS   Time spent on care of this patient: 25 mins  [facility] Memorial HospitalMCCLUNG,Khambrel Amsden T  [facility] Office  [phone] Pager - Text Page per Loretha StaplerAmion as per below:  On-Call/Text Page:      [email or web address]      password [email or web address]  If 7PM-7AM, please contact night-coverage [email or web address] Password [email or web address] 11/18/2013, 1:27 PM   LOS: 2 days

## 2013-11-19 ENCOUNTER — Telehealth: Payer: Self-pay | Admitting: Family Medicine

## 2013-11-19 LAB — BASIC METABOLIC PANEL
BUN: 9 mg/dL (ref 6–23)
BUN: 9 mg/dL (ref 6–23)
CO2: 27 mEq/L (ref 19–32)
CO2: 30 mEq/L (ref 19–32)
Calcium: 8.5 mg/dL (ref 8.4–10.5)
Calcium: 8.7 mg/dL (ref 8.4–10.5)
Chloride: 96 mEq/L (ref 96–112)
Chloride: 98 mEq/L (ref 96–112)
Creatinine, Ser: 0.84 mg/dL (ref 0.50–1.35)
Creatinine, Ser: 0.86 mg/dL (ref 0.50–1.35)
GFR calc Af Amer: >90 mL/min (ref >90)
GFR calc Af Amer: >90 mL/min (ref >90)
GFR calc non Af Amer: >90 mL/min (ref >90)
GFR calc non Af Amer: >90 mL/min (ref >90)
Glucose, Bld: 180 mg/dL — ABNORMAL HIGH (ref 70–99)
Glucose, Bld: 239 mg/dL — ABNORMAL HIGH (ref 70–99)
Potassium: 2.8 mEq/L — CL (ref 3.7–5.3)
Potassium: 3.8 mEq/L (ref 3.7–5.3)
Sodium: 134 mEq/L — ABNORMAL LOW (ref 137–147)
Sodium: 137 mEq/L (ref 137–147)

## 2013-11-19 LAB — GLUCOSE, CAPILLARY
Glucose-Capillary: 159 mg/dL — ABNORMAL HIGH (ref 70–99)
Glucose-Capillary: 192 mg/dL — ABNORMAL HIGH (ref 70–99)
Glucose-Capillary: 81 mg/dL (ref 70–99)

## 2013-11-19 LAB — MAGNESIUM: Magnesium: 1.8 mg/dL (ref 1.5–2.5)

## 2013-11-19 MED ORDER — INSULIN ASPART 100 UNIT/ML ~~LOC~~ SOLN
8.0000 [IU] | Freq: Three times a day (TID) | SUBCUTANEOUS | Status: DC
Start: 2013-11-19 — End: 2013-11-19
  Administered 2013-11-19: 8 [IU] via SUBCUTANEOUS

## 2013-11-19 MED ORDER — POTASSIUM CHLORIDE CRYS ER 20 MEQ PO TBCR
40.0000 meq | EXTENDED_RELEASE_TABLET | Freq: Once | ORAL | Status: AC
  Administered 2013-11-19: 40 meq via ORAL
  Filled 2013-11-19: qty 2

## 2013-11-19 MED ORDER — INSULIN LISPRO 100 UNIT/ML (KWIKPEN): 8.0000 [IU] | PEN_INJECTOR | Freq: Three times a day (TID) | SUBCUTANEOUS | Status: DC

## 2013-11-19 MED ORDER — INSULIN GLARGINE 100 UNIT/ML SOLOSTAR PEN: 14.0000 [IU] | PEN_INJECTOR | Freq: Every day | SUBCUTANEOUS | Status: DC

## 2013-11-19 MED ORDER — INSULIN GLARGINE 100 UNIT/ML ~~LOC~~ SOLN
14.0000 [IU] | Freq: Every day | SUBCUTANEOUS | Status: DC
  Filled 2013-11-19: qty 0.14

## 2013-11-19 MED ORDER — POTASSIUM CHLORIDE 10 MEQ/100ML IV SOLN
10.0000 meq | INTRAVENOUS | Status: DC
  Filled 2013-11-19 (×3): qty 100

## 2013-11-19 MED ORDER — VALSARTAN-HYDROCHLOROTHIAZIDE 320-25 MG PO TABS: 1.0000 | ORAL_TABLET | Freq: Every day | ORAL | Status: DC

## 2013-11-19 NOTE — Telephone Encounter (Signed)
Wife called and stated that pt is in hospital and she wanted to talk about his dx; Triage RN attempted to reach wife but VM received and message left to call office back if still needing our assistance CD/CAN

## 2013-11-19 NOTE — Telephone Encounter (Signed)
FYI

## 2013-11-19 NOTE — Telephone Encounter (Signed)
Patient Information:  Caller Name: Craig Nguyen  Phone: [phone]  Patient: Craig Nguyen, Craig Nguyen  Gender: Male  DOB: 13-Dec-1970  Age: [age] Years  PCP: Gershon CraneFry, [facility])  Office Follow Up:  Does the office need to follow up with this patient?: No  Instructions For The Office: N/A  RN Note:  Advised spouse message was sent to office for Dr Abran CantorFrye.  Symptoms  Reason For Call & Symptoms: Pt 's wife/Victoria states she called and spoke with nurse this AM concerning her husband and recent admission to hospital. Craig Nguyen wanted to know if message was sent to Dr Abran CantorFrye.  Reviewed Health History In EMR: N/A  Reviewed Medications In EMR: N/A  Reviewed Allergies In EMR: N/A  Reviewed Surgeries / Procedures: N/A  Date of Onset of Symptoms: 11/19/2013  Guideline(s) Used:  No Protocol Available - Information Only  Disposition Per Guideline:   Home Care  Reason For Disposition Reached:   Information only question and nurse able to answer  Advice Given:  N/A  Patient Will Follow Care Advice:  YES

## 2013-11-19 NOTE — Discharge Summary (Signed)
DISCHARGE SUMMARY  Craig Nguyen  MR#: [id number]  DOB:August 22, 1971  Date of Admission: 11/16/2013 Date of Discharge: 11/19/2013  Attending Physician:Makinzey Banes T  Patient's PCP:FRY,STEPHEN A, MD  Consults: none  Disposition: D/C home   Follow-up Appts:     Follow-up Information   Follow up with FRY,STEPHEN A, MD. Schedule an appointment as soon as possible for a visit in 3 days.   Specialty:  Family Medicine   Contact information:   [place] [address] [phone]       Schedule an appointment as soon as possible for a visit with Foye Spurling, MD.   Specialty:  Internal Medicine   Contact information:   [address] 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 male who was diagnosed with new onset DM2 earlier in the week prior to his admission. He was initially started on oral medications, before being backed down on levels of these PO medications due to concerns that he was overmedicated. Unfortunately, after reducing his DM medication doses he developed worsening generalized weakness, N/V, and so presented to the ED.   In the ED his BGL was 590 and he was noted to be in DKA.   Hospital Course:  DKA in newly diagnosed uncontrolled DM2  Gap rapidly closed and bicarb normalized - has not relapsed into DKA off gtt - given recent signif weight loss (catabolic sx) and presentation in DKA I feel this pt will need insulin tx - he is very much interested in determining if this is type 1 or type  2, and is very hopeful that he will eventually be able to go back to oral meds alone - I have ordered GAD and islet cell antibodies to attempt to make a formal diagnosis of type 1, as this will necessitate life long insulin tx - diabetic education consult completed - nutrition consult completed - CBG reasonbly controlled without marked hypoglycemia or return to DKA - educated pt at length myself on warning sx of hypogoycemia or DKA, and need to seek immediate help should he develop these sx - advised pt to follow CBGs very closely at home and to alert his PCP if his CBG is consistently below 70 or >200   HTN  BP poorly controlled during stay - renal function normal - medical tx adjsuted and BP improved at time of d/c    Mild Hypokalemia  Replaced and normalize - partially due to tx of hyperglycemia, as well as poor intake   ASA allergy  Pt reports actual hives and generalized swelling w/ aspirin - therefore, he is felt to be at risk for anaphylaxis and should avoid ASA - have suggested that future allergy testing and potential desensitization may prove to be beneficial given his new diagnosis of DM     Medication List    STOP taking these medications       metFORMIN 500 MG 24 hr tablet  Commonly known as:  GLUCOPHAGE-XR     saxagliptin HCl 2.5 MG  Tabs tablet  Commonly known as:  ONGLYZA     valsartan-hydrochlorothiazide 160-25 MG per tablet  Commonly known as:  DIOVAN-HCT  Replaced by:  valsartan-hydrochlorothiazide 320-25 MG per tablet      TAKE these medications       glucose monitoring kit monitoring kit  1 each by Does not apply route as needed for other. Dispense appropriate test strips and lancets for twice daily use.     Insulin Glargine 100 UNIT/ML Solostar Pen  Commonly known as:  LANTUS SOLOSTAR  Inject 14 Units into the skin daily at 10 pm.     insulin lispro 100 UNIT/ML KiwkPen  Commonly known as:  HUMALOG KWIKPEN  Inject 8 Units into the skin 3 (three) times daily.  WITH A MEAL     valsartan-hydrochlorothiazide 320-25 MG per tablet  Commonly known as:  DIOVAN HCT  Take 1 tablet by mouth daily.     VITAMIN B 12 PO  Take 1 tablet by mouth daily.     vitamin C 100 MG tablet  Take 100 mg by mouth daily.       Day of Discharge BP 120/75  Pulse 92  Temp(Src) 97.8 F (36.6 C) (Oral)  Resp 18  Ht 6' (1.829 m)  Wt 91.3 kg (201 lb 4.5 oz)  BMI 27.29 kg/m2  SpO2 100%  Physical Exam: General: No acute respiratory distress Lungs: Clear to auscultation bilaterally without wheezes or crackles Cardiovascular: Regular rate and rhythm without murmur gallop or rub Abdomen: Nontender, nondistended, soft, bowel sounds positive, no rebound, no ascites, no appreciable mass Extremities: No significant cyanosis, clubbing, or edema bilateral lower extremities  Results for orders placed during the hospital encounter of 11/16/13 (from the past 24 hour(s))  GLUCOSE, CAPILLARY     Status: Abnormal   Collection Time    11/18/13  4:17 PM      Result Value Range   Glucose-Capillary 188 (*) 70 - 99 mg/dL  GLUCOSE, CAPILLARY     Status: None   Collection Time    11/18/13  9:24 PM      Result Value Range   Glucose-Capillary 75  70 - 99 mg/dL  GLUCOSE, CAPILLARY     Status: None   Collection Time    11/18/13 11:51 PM      Result Value Range   Glucose-Capillary 73  70 - 99 mg/dL  BASIC METABOLIC PANEL     Status: Abnormal   Collection Time    11/19/13  4:20 AM      Result Value Range   Sodium 134 (*) 137 - 147 mEq/L   Potassium 2.8 (*) 3.7 - 5.3 mEq/L   Chloride 96  96 - 112 mEq/L   CO2 27  19 - 32 mEq/L   Glucose, Bld 180 (*) 70 - 99 mg/dL   BUN 9  6 - 23 mg/dL   Creatinine, Ser 0.84  0.50 - 1.35 mg/dL   Calcium 8.5  8.4 - 10.5 mg/dL   GFR calc non Af Amer >90  >90 mL/min   GFR calc Af Amer >90  >90 mL/min  MAGNESIUM     Status: None   Collection Time    11/19/13  4:20 AM      Result Value Range   Magnesium 1.8  1.5 - 2.5 mg/dL  GLUCOSE, CAPILLARY      Status: Abnormal   Collection Time    11/19/13  7:03 AM      Result Value Range   Glucose-Capillary 159 (*)  70 - 99 mg/dL  GLUCOSE, CAPILLARY     Status: Abnormal   Collection Time    11/19/13 11:24 AM      Result Value Range   Glucose-Capillary 192 (*) 70 - 99 mg/dL  BASIC METABOLIC PANEL     Status: Abnormal   Collection Time    11/19/13 12:26 PM      Result Value Range   Sodium 137  137 - 147 mEq/L   Potassium 3.8  3.7 - 5.3 mEq/L   Chloride 98  96 - 112 mEq/L   CO2 30  19 - 32 mEq/L   Glucose, Bld 239 (*) 70 - 99 mg/dL   BUN 9  6 - 23 mg/dL   Creatinine, Ser 0.86  0.50 - 1.35 mg/dL   Calcium 8.7  8.4 - 10.5 mg/dL   GFR calc non Af Amer >90  >90 mL/min   GFR calc Af Amer >90  >90 mL/min    Time spent in discharge (includes decision making & examination of pt): >30 minutes  11/19/2013, 3:49 PM   Cherene Altes, MD [facility] Office  [phone] Pager [phone]  On-Call/Text Page:      [email or web address]      password [facility]

## 2013-11-19 NOTE — Progress Notes (Signed)
Inpatient Diabetes Program Recommendations  AACE/ADA: New Consensus Statement on Inpatient Glycemic Control (2013)  Target Ranges:  Prepandial:   less than 140 mg/dL      Peak postprandial:   less than 180 mg/dL (1-2 hours)      Critically ill patients:  140 - 180 mg/dL   Reason for Visit: Referral received.  Patient admitted with DKA and New onset diabetes.  He will be discharged home today.  Talked at length with patient and wife to reinforce excellent teaching already done by Dr. Sharon SellerMcClung and nursing staff.  Demonstrated use of insulin pen to patient.  He was able to return demonstration.  Will follow up with PCP and endocrinologist.  Patient and wife had great questions regarding diet, Type 1 vs. Type 2 diabetes, treatment of hypoglycemia, etc.  Gave him further information regarding diet, exercise and insulin pens.  Expect great compliance.  Patient is very concerned about job b/c he works for [employer].  Plans to return to work on Friday.  Discussed CBG goals and importance of close monitoring before meals and at bedtime, consistent diet, and exercise (including that it may drop CBG's).  Patient plans to keep a food diary.  Also gave him CBG log. Will order outpatient diabetes education per protocol. Patient has appointment with Dr. Margaretmary BayleyPreston Clark on Thursday at 11 am.  Beryl MeagerJenny Densil Ottey, RN, BC-ADM Inpatient Diabetes Coordinator Pager [phone]

## 2013-11-19 NOTE — Discharge Instructions (Signed)
Diabetic Ketoacidosis  Diabetic ketoacidosis (DKA) is a life-threatening complication of diabetes. It must be quickly recognized and treated. Treatment requires hospitalization. CAUSES  When there is no insulin in the body, glucose (sugar) cannot be used and the body breaks down fat for energy. When fat breaks down, acids (ketones) build up in the blood. Very high levels of glucose and high levels of acids lead to severe loss of body fluids (dehydration) and other dangerous chemical changes. This stresses your vital organs and can cause coma or death. SYMPTOMS   Tiredness (fatigue).  Weight loss.  Excessive thirst.  Ketones in the urine.  Lightheadedness.  Fruity or sweet smell on your breath.  Excessive urination.  Visual changes.  Confusion or irritability.  Feeling sick to your stomach (nauseous) or vomiting.  Rapid breathing.  Stomachache or belly (abdominal) pain. DIAGNOSIS  Your caregiver will diagnose DKA based on your history, physical exam, and blood tests. Your caregiver will check if there is another illness present which caused you to go into DKA. Most of this will be done quickly in an emergency room. TREATMENT   Fluid replacement to correct dehydration.  Insulin.  Correction of electrolytes, such as potassium and sodium.  Medicines (antibiotics) that kill germs for infections. PREVENTION  Always take your insulin. Do not skip your insulin injections.  If you are ill, treat yourself quickly. Your body often needs more insulin to fight the illness.  Check your blood glucose regularly.  Check urine ketones if your blood glucose is greater than 240 milligrams per deciliter (mg/dl).  Do not used expired or outdated insulin.  If your blood glucose is high, drink plenty of fluids. This helps flush out ketones. HOME CARE INSTRUCTIONS   If you are ill, follow the advice of your caregiver.  To prevent loss of body fluids (dehydration), drink enough  water and fluids to keep your urine clear or pale yellow.  If you cannot eat, alternate between drinking fluids with sugar (soda, juices, flavored gelatin) and salty fluids (broth, bouillon).  If you can eat, follow your usual diet and drink sugar-free liquids (water, diet drinks).  Always take your usual dose of insulin. If you cannot eat, or your glucose is getting too low, call your caregiver for further instructions.  Continue to monitor your blood or urine ketones every 3 to 4 hours around the clock. Set your alarm clock or have someone wake you up. If you are too sick, have someone test it for you.  Rest and avoid exercise. SEEK MEDICAL CARE IF:   You have ketones in your urine or your blood glucose is higher than a level your caregiver suggests. You may need extra insulin. Call your caregiver if you need advice on adjusting your insulin.  You cannot drink at least a tablespoon of fluid every 15 to 20 minutes.  You have been throwing up for more than 2 hours.  You have symptoms of DKA:  Fruity smelling breath.  Breathing faster or slower.  Becoming very sleepy. SEEK IMMEDIATE MEDICAL CARE IF:   You have signs of dehydration:  Decreased urination.  Increased thirst.  Dry skin and mouth.  Lightheadedness.  Your blood glucose is very high (as advised by your caregiver) twice in a row.  You or your child has an oral temperature above 102 F (38.9 C), not controlled by medicine.  You pass out.  You have chest pain and/or trouble breathing.  You have a sudden, severe headache.  You have sudden weakness  in one arm and/or one leg.  You have sudden difficulty speaking and/or swallowing.  You develop vomiting and/or diarrhea that is getting worse after 3 to 4 hours.  You have abdominal pain. MAKE SURE YOU:   Understand these instructions.  Will watch your condition.  Will get help right away if you are not doing well or get worse. Document Released:  10/22/2000 Document Revised: 01/17/2012 Document Reviewed: 04/30/2009 [facility] Patient Information 2014 [employer] [place].  Diabetes and Exercise Exercising regularly is important. It is not just about losing weight. It has many health benefits, such as:  Improving your overall fitness, flexibility, and endurance.  Increasing your bone density.  Helping with weight control.  Decreasing your body fat.  Increasing your muscle strength.  Reducing stress and tension.  Improving your overall health. People with diabetes who exercise gain additional benefits because exercise:  Reduces appetite.  Improves the body's use of blood sugar (glucose).  Helps lower or control blood glucose.  Decreases blood pressure.  Helps control blood lipids (such as cholesterol and triglycerides).  Improves the body's use of the hormone insulin by:  Increasing the body's insulin sensitivity.  Reducing the body's insulin needs.  Decreases the risk for heart disease because exercising:  Lowers cholesterol and triglycerides levels.  Increases the levels of good cholesterol (such as high-density lipoproteins [HDL]) in the body.  Lowers blood glucose levels. YOUR ACTIVITY PLAN  Choose an activity that you enjoy and set realistic goals. Your health care provider or diabetes educator can help you make an activity plan that works for you. You can break activities into 2 or 3 sessions throughout the day. Doing so is as good as one long session. Exercise ideas include:  Taking the dog for a walk.  Taking the stairs instead of the elevator.  Dancing to your favorite song.  Doing your favorite exercise with a friend. RECOMMENDATIONS FOR EXERCISING WITH TYPE 1 OR TYPE 2 DIABETES   Check your blood glucose before exercising. If blood glucose levels are greater than 240 mg/dL, check for urine ketones. Do not exercise if ketones are present.  Avoid injecting insulin into areas of the body that are going  to be exercised. For example, avoid injecting insulin into:  The arms when playing tennis.  The legs when jogging.  Keep a record of:  Food intake before and after you exercise.  Expected peak times of insulin action.  Blood glucose levels before and after you exercise.  The type and amount of exercise you have done.  Review your records with your health care provider. Your health care provider will help you to develop guidelines for adjusting food intake and insulin amounts before and after exercising.  If you take insulin or oral hypoglycemic agents, watch for signs and symptoms of hypoglycemia. They include:  Dizziness.  Shaking.  Sweating.  Chills.  Confusion.  Drink plenty of water while you exercise to prevent dehydration or heat stroke. Body water is lost during exercise and must be replaced.  Talk to your health care provider before starting an exercise program to make sure it is safe for you. Remember, almost any type of activity is better than none. Document Released: 01/15/2004 Document Revised: 06/27/2013 Document Reviewed: 04/03/2013 [employer] Patient Information 2014 [employer], [place].  ________________________________________________________________________________________  FOR WORK:  Please note - [facility]. Craig Nguyen [facility] through 1/12.  He has been advised to refrain from work until Friday, 11/23/2013.  Dr. Lonia BloodJeffrey T. Jahniya Duzan [facility] [phone]

## 2013-11-19 NOTE — Progress Notes (Signed)
cbg 75 at 2100,73 at 2330 discussed 26 units lantus order with pt,states didn't eat much dinner,comfortable to take 1/2 dose of 13 units insulin at this time,ate snack.Craig HeadlandBeverly, Craig Nguyen D

## 2013-11-19 NOTE — Progress Notes (Signed)
Utilization review completed.  

## 2013-11-21 ENCOUNTER — Encounter: Payer: Self-pay | Admitting: Family Medicine

## 2013-11-21 ENCOUNTER — Ambulatory Visit (INDEPENDENT_AMBULATORY_CARE_PROVIDER_SITE_OTHER): Payer: BC Managed Care – PPO | Admitting: Family Medicine

## 2013-11-21 VITALS — BP 122/80 | HR 118 | Temp 98.9°F | Wt 216.0 lb

## 2013-11-21 DIAGNOSIS — I10 Essential (primary) hypertension: Secondary | ICD-10-CM

## 2013-11-21 DIAGNOSIS — E1110 Type 2 diabetes mellitus with ketoacidosis without coma: Secondary | ICD-10-CM

## 2013-11-21 DIAGNOSIS — E119 Type 2 diabetes mellitus without complications: Secondary | ICD-10-CM

## 2013-11-21 NOTE — Progress Notes (Signed)
Pre visit review using our clinic review tool, if applicable. No additional management support is needed unless otherwise documented below in the visit note. 

## 2013-11-22 ENCOUNTER — Telehealth: Payer: Self-pay

## 2013-11-22 ENCOUNTER — Encounter: Payer: Self-pay | Admitting: Family Medicine

## 2013-11-22 ENCOUNTER — Other Ambulatory Visit: Payer: Self-pay | Admitting: Emergency Medicine

## 2013-11-22 LAB — GLUTAMIC ACID DECARBOXYLASE AUTO ABS: Glutamic Acid Decarb Ab: <1 U/mL (ref <=1.0)

## 2013-11-22 NOTE — Telephone Encounter (Signed)
Notified [facility] that endocrinologist needs to manage insulin Rx and they stated they will inform pt.

## 2013-11-22 NOTE — Telephone Encounter (Signed)
Patient has been treated by an endocrinologist We will let him manage the diabetes

## 2013-11-22 NOTE — Telephone Encounter (Signed)
Dr Dareen PianoAnderson, [facility] called to inform Victoza is not covered. They cover byetta, byfureon and trulicity as alternatives. Can we send in Rx for one of these? Ref # [id number] should be in comments on Rx.

## 2013-11-22 NOTE — Progress Notes (Signed)
   Subjective:    Patient ID: Craig Nguyen, male    DOB: 26-Nov-1970, 43 y.o.   MRN: [id number]  HPI Here with his wife to follow up a hospital stay from 11-16-13 to 11-19-13 for DKA and rapidly worsening diabetes. He was just diagnosed with DM last month and was started on oral agents. However he quickly started having vomiting and diarrhea, and ended up going into a metabolic acidosis. He was admitted with a glucose of 592. His initial A1c several weeks ago was 11.7. He was stabilized with IV fluids and insulin, and he was sent home on Lantus and Humalog. He has felt much better since getting home, no more NVD. His glucoses have been mostly in the range of 200 to 250. He is scheduled to see Dr. Margaretmary BayleyPreston Clark tomorrow.    Review of Systems  Constitutional: Negative.   Respiratory: Negative.   Cardiovascular: Negative.   Gastrointestinal: Negative.   Neurological: Negative.        Objective:   Physical Exam  Constitutional: He is oriented to person, place, and time. He appears well-developed and well-nourished.  Cardiovascular: Normal rate, regular rhythm, normal heart sounds and intact distal pulses.   Pulmonary/Chest: Effort normal and breath sounds normal.  Neurological: He is alert and oriented to person, place, and time.          Assessment & Plan:  He seems to be doing well but we need to get the diabetes under tighter control. We will increase his Humalog from 8 units to 10 TID with meals and we will increase the Lantus from 14 to 20 units at bedtime. He will see Dr. Chestine Sporelark tomorrow. He has been out of work this whole time, and he is anxious to return to work ASAP for financial reasons. 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 His main concern is to get off insulin and back onto oral agents, but it is not clear whether this will be possible. He will stay out of work for the time being.

## 2013-11-22 NOTE — Telephone Encounter (Signed)
Relevant patient education mailed to patient.  

## 2013-11-23 ENCOUNTER — Ambulatory Visit (INDEPENDENT_AMBULATORY_CARE_PROVIDER_SITE_OTHER): Payer: BC Managed Care – PPO | Admitting: Family Medicine

## 2013-11-23 ENCOUNTER — Encounter: Payer: Self-pay | Admitting: Family Medicine

## 2013-11-23 VITALS — BP 110/60 | HR 100 | Temp 98.7°F | Ht 72.0 in | Wt 218.0 lb

## 2013-11-23 DIAGNOSIS — I10 Essential (primary) hypertension: Secondary | ICD-10-CM

## 2013-11-23 DIAGNOSIS — E119 Type 2 diabetes mellitus without complications: Secondary | ICD-10-CM

## 2013-11-23 NOTE — Progress Notes (Signed)
   Subjective:    Patient ID: Craig Nguyen, male    DOB: 23-Apr-1971, 43 y.o.   MRN: [id number]  HPI Here with his wife to discuss his visit with Dr. Margaretmary BayleyPreston Clark yesterday. His glutamic acid decarboxylase antibodies are negative but the islet cell antibodies are still pending. We have been trying to decide if he has type 1 or type 2 diabetes, and this is to help us project whether he may have a chance to get off insulin at some point in the future. 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 He feels better today, in fact he feels back to normal. His random glucoses in the past 24 hours have run from 170 to 240. I spoke to Dr. Chestine Sporelark today on the phone and we agree that Craig Nguyen will likely require insulin the rest of his life. Dr. Chestine Sporelark suggested we check a C peptide level and get a CT scan of his pancreas. His BP has been a bit low as well (the dose of his BP med was increased while he was in the hospital).    Review of Systems  Constitutional: Negative.   Respiratory: Negative.   Cardiovascular: Negative.   Neurological: Negative.        Objective:   Physical Exam  Constitutional: He is oriented to person, place, and time. He appears well-developed and well-nourished. No distress.  Cardiovascular: Normal rate, regular rhythm, normal heart sounds and intact distal pulses.   Pulmonary/Chest: Effort normal and breath sounds normal.  Neurological: He is alert and oriented to person, place, and time.          Assessment & Plan:  He is doing well all in all. We will increase the Lantus to 24 units daily and keep the Humalog the same for now. We will decrease the Valsartan HCT back to 160-25 daily. Get a C peptide and an abdominal CT. I spoke to NorthglennLeonard and his wife for 30 minutes about the insulin issue, and I told him I felt he would need to stay on this. I suggested he speak to HR at [employer] and see if they can help him transition to  another job with the company that would not involve driving a vehicle. We wrote a note today excusing him from work starting with 11-12-13 through 12-12-13. Plan to return to work 12-13-13.

## 2013-11-23 NOTE — Progress Notes (Signed)
Pre visit review using our clinic review tool, if applicable. No additional management support is needed unless otherwise documented below in the visit note. 

## 2013-11-26 ENCOUNTER — Telehealth: Payer: Self-pay | Admitting: Family Medicine

## 2013-11-26 ENCOUNTER — Other Ambulatory Visit: Payer: BC Managed Care – PPO

## 2013-11-26 ENCOUNTER — Ambulatory Visit: Payer: BC Managed Care – PPO | Admitting: Family Medicine

## 2013-11-26 NOTE — Telephone Encounter (Signed)
Relevant patient education mailed to patient.  

## 2013-11-27 LAB — C-PEPTIDE: C-Peptide: 0.75 ng/mL — ABNORMAL LOW (ref 0.80–3.90)

## 2013-11-29 NOTE — Addendum Note (Signed)
Addended by: Gershon CraneFRY, STEPHEN A on: 11/29/2013 01:03 PM   Modules accepted: Orders

## 2013-12-04 LAB — ANTI-ISLET CELL ANTIBODY: Pancreatic Islet Cell Antibody: <5 JDF Units (ref <5)

## 2013-12-07 ENCOUNTER — Ambulatory Visit: Payer: BC Managed Care – PPO | Admitting: Family Medicine

## 2013-12-10 ENCOUNTER — Telehealth: Payer: Self-pay | Admitting: Family Medicine

## 2013-12-10 NOTE — Telephone Encounter (Signed)
Pt wants to make dr fry aware of the rx changes from dr.clark. Requesting a c/b from the nurse.

## 2013-12-11 ENCOUNTER — Ambulatory Visit: Payer: BC Managed Care – PPO | Admitting: Family Medicine

## 2013-12-11 NOTE — Telephone Encounter (Signed)
I spoke with pt's wife and pt is weaning off of the insulin per Dr. Ophelia Charterlark's instructions. Pt has started on Kombiglyze EX [id number] mg. Pt just wanted you to be aware of this. He goes back for a follow up with above doctor on 12/17/13. Pt requesting a call back after you review this note.

## 2013-12-12 ENCOUNTER — Telehealth: Payer: Self-pay | Admitting: Family Medicine

## 2013-12-12 NOTE — Telephone Encounter (Signed)
Relevant patient education mailed to patient.  

## 2013-12-20 ENCOUNTER — Ambulatory Visit: Payer: BC Managed Care – PPO | Admitting: *Deleted

## 2013-12-25 ENCOUNTER — Ambulatory Visit: Payer: BC Managed Care – PPO | Admitting: *Deleted

## 2013-12-27 ENCOUNTER — Telehealth: Payer: Self-pay

## 2013-12-27 NOTE — Telephone Encounter (Signed)
Patient called regarding his disability paperwork and the status of that request. I advised him that we received the faxed info from [employer] today. I knew that because I personally saw the paperwork that was placed inside of the disabilities box. I transferred his call to the disabilities desk but assured him that I would also put in a telephone message for you to see. Please return his call at [phone]. Thank you!

## 2014-02-04 ENCOUNTER — Encounter: Payer: BC Managed Care – PPO | Attending: Family Medicine | Admitting: Dietician

## 2014-02-04 ENCOUNTER — Encounter: Payer: Self-pay | Admitting: Dietician

## 2014-02-04 DIAGNOSIS — Z713 Dietary counseling and surveillance: Secondary | ICD-10-CM | POA: Insufficient documentation

## 2014-02-04 DIAGNOSIS — E119 Type 2 diabetes mellitus without complications: Secondary | ICD-10-CM | POA: Insufficient documentation

## 2014-02-04 NOTE — Patient Instructions (Signed)
Goals:  Follow Diabetes Meal Plan as instructed  Eat 3 meals and 2 snacks, every 3-5 hrs  Limit carbohydrate intake to 45-60 grams carbohydrate/meal  Limit carbohydrate intake to 15 grams carbohydrate/snack  Add lean protein foods to meals/snacks  Monitor glucose levels as instructed by your doctor  Aim for 60-90 mins of physical activity most days of the week   Bring food record and glucose log to your next nutrition visit

## 2014-02-04 NOTE — Progress Notes (Signed)
Appt start time: 1130 end time:  1245.   Assessment:  Patient was seen on  02/04/2014 for individual diabetes education. Craig Nguyen was diagnosed with diabetes in early January when he was hospitalized with DKA. Previously was told he had pre-diabetes (124 md/dl fasting). 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  Currently not taking insulin anymore and glucose is well controlled. Testing fasting (79-105 mg/dl) and 3 hours after meals (80-90 mg/dl). Hgb A1c in 11.7 on 11/12/2013 and having another test in May.   Lost about 40 lbs since November. Cut out bread, rice, alcohol, fried foods and started exercising for about 90 minutes 5 x week.   Works as a truck and works at night. Lives with his wife and 489 years old. States that his wife does the food preparation at home. Would like to lose another 15 lbs.   Current HbA1c: 11.7  Preferred Learning Style:   No preference indicated   Learning Readiness:   Ready  MEDICATIONS: Kombiglyze  DIETARY INTAKE:  Avoided foods include bread, rice, alcohol, fried foods  24-hr recall:   6:30PM: vegetable, protein with fruit Snack: cheese stick or nuts with fruit 12:00 AM: leftovers from dinner, potatoes, chicken, fish Snack: cheese stick or nuts  Morning meal when gets home: egg whites with bacon with grits   Beverages: mainly water or unsweet tea with truvia  Usual physical activity: 90 minutes 5 x week  Estimated energy needs: 2200 calories 248 g carbohydrates 165 g protein 61 g fat  Progress Towards Goal(s):  In progress.   Nutritional Diagnosis:  [place]-2.1 Inpaired nutrition utilization As related to glucose metabolism.  As evidenced by Hgb A1c of 11.7%.    Intervention:  Nutrition counseling provided.  Discussed diabetes disease process and treatment options.  Discussed physiology of diabetes and role of obesity on insulin resistance.  Encouraged moderate weight reduction to improve glucose levels.   Discussed role of medications and diet in glucose control  Provided education on macronutrients on glucose levels.  Provided education on carb counting, importance of regularly scheduled meals/snacks, and meal planning  Discussed effects of physical activity on glucose levels and long-term glucose control.  Recommended 150 minutes of physical activity/week.  Reviewed patient medications.  Discussed role of medication on blood glucose and possible side effects  Discussed blood glucose monitoring and interpretation.  Discussed recommended target ranges and individual ranges.    Described short-term complications: hyper- and hypo-glycemia.  Discussed causes,symptoms, and treatment options.  Discussed prevention, detection, and treatment of long-term complications.  Discussed the role of prolonged elevated glucose levels on body systems.  Discussed role of stress on blood glucose levels and discussed strategies to manage psychosocial issues.  Discussed recommendations for long-term diabetes self-care.  Established checklist for medical, dental, and emotional self-care.  Teaching Method Utilized:  Visual Auditory Hands on  Handouts given during visit include:  Living Well with Diabetes  Yellow Care  MyPlate Handout  15 g CHO Snacks  Blood Glucose Monitoring  Barriers to learning/adherence to lifestyle change: none  Diabetes self-care support plan:   [facility] support group  Demonstrated degree of understanding via:  Teach Back   Monitoring/Evaluation:  Dietary intake, exercise,  and body weight prn.

## 2014-03-18 ENCOUNTER — Ambulatory Visit: Payer: BC Managed Care – PPO | Admitting: Family Medicine

## 2014-06-03 LAB — LIPID PANEL: LDL Cholesterol: 98 mg/dL

## 2014-06-04 LAB — HEMOGLOBIN A1C: Hgb A1c MFr Bld: 5 % (ref 4.0–6.0)

## 2014-06-10 ENCOUNTER — Encounter: Payer: Self-pay | Admitting: Family Medicine

## 2014-09-13 ENCOUNTER — Other Ambulatory Visit: Payer: Self-pay | Admitting: Family Medicine

## 2014-09-13 MED ORDER — VALSARTAN-HYDROCHLOROTHIAZIDE 160-25 MG PO TABS: 1.0000 | ORAL_TABLET | Freq: Every day | ORAL | Status: DC

## 2014-09-13 NOTE — Telephone Encounter (Signed)
Rx sent 

## 2014-09-13 NOTE — Telephone Encounter (Signed)
[facility] - [place], [place] - [address] is requesting re-fill on valsartan-hydrochlorothiazide (DIOVAN-HCT) 160-25 MG per tablet

## 2014-09-18 IMAGING — US US SOFT TISSUE HEAD/NECK
1 series · 14 of 25 positions shown · non-contrast
Comparison: 10/26/2010

CLINICAL DATA: Follow-up thyroid nodule.

THYROID ULTRASOUND
TECHNIQUE: Ultrasound examination of the thyroid gland and adjacent
soft tissues was performed.

[Series 1: us soft tissue head/neck · 0.11mm/px · 14 of 53 slices shown]
[im 1/53]
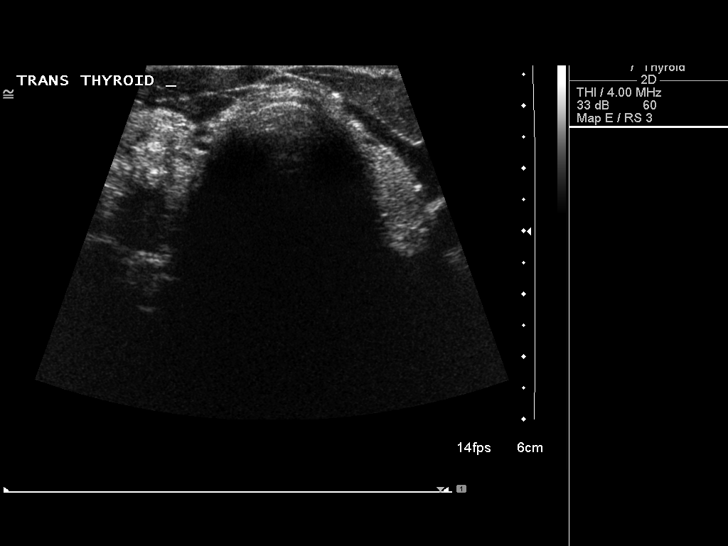
[im 5/53]
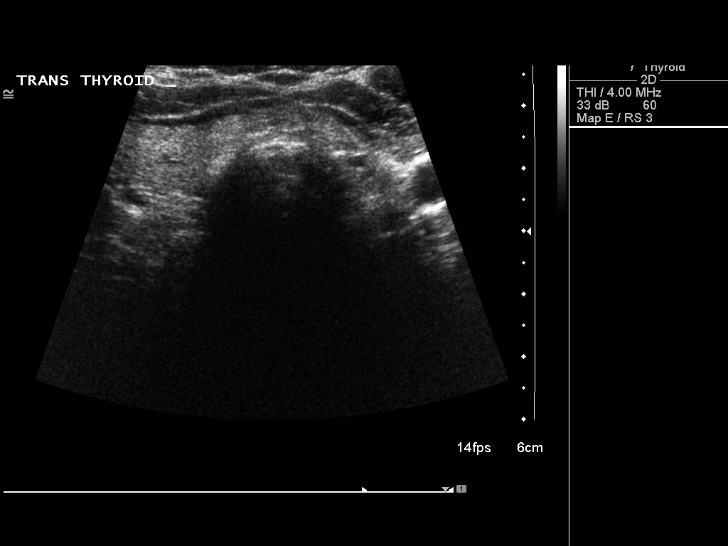
[im 9/53]
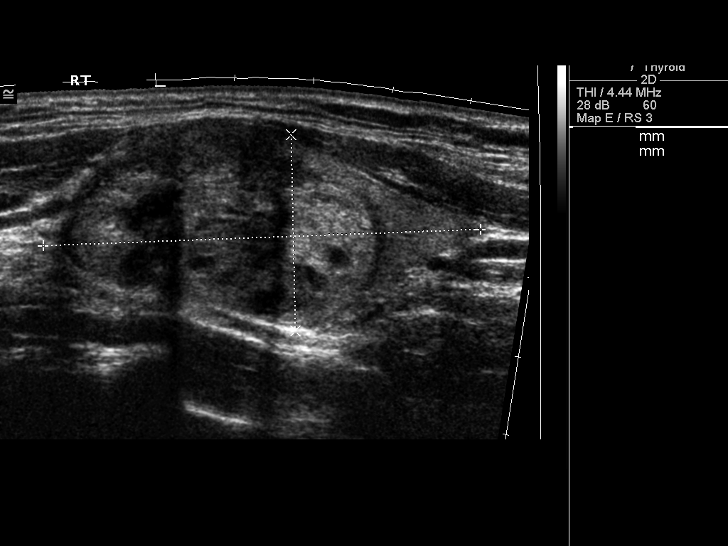
[im 14/53]
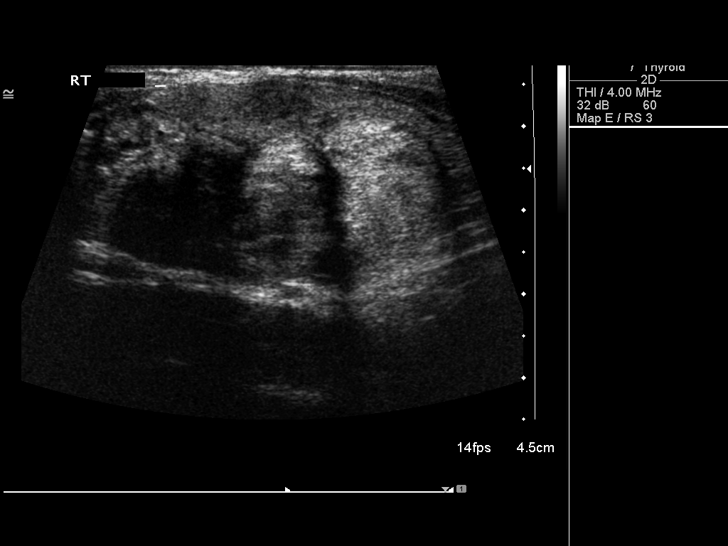
[im 18/53]
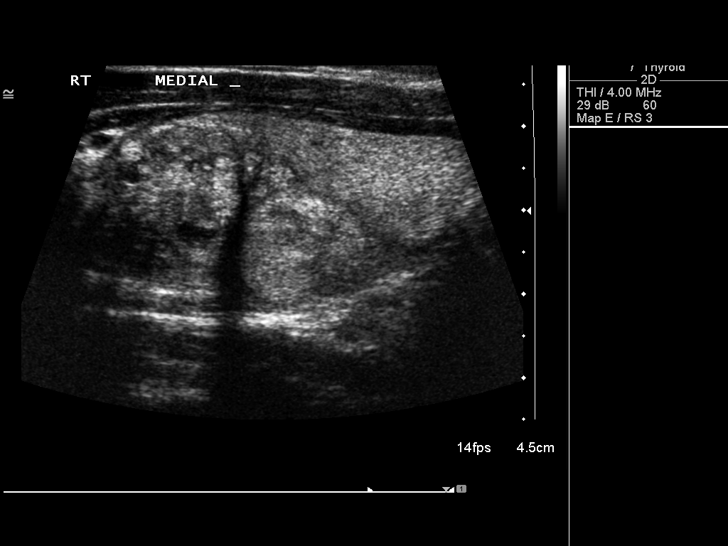
[im 20/53]
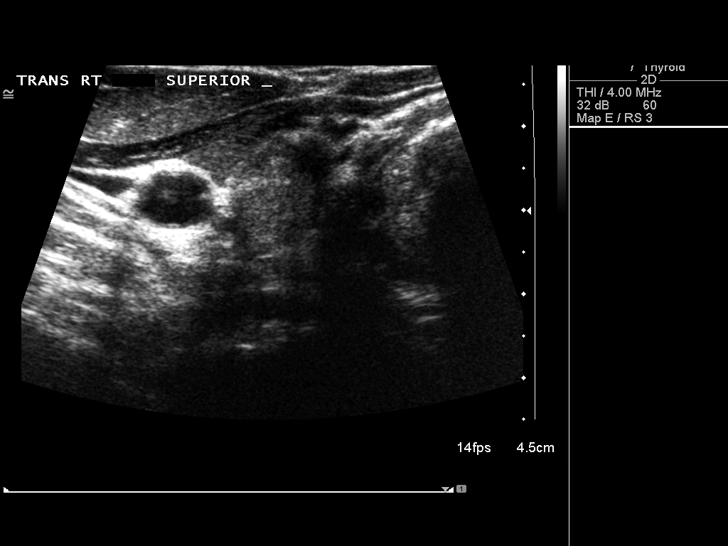
[im 24/53]
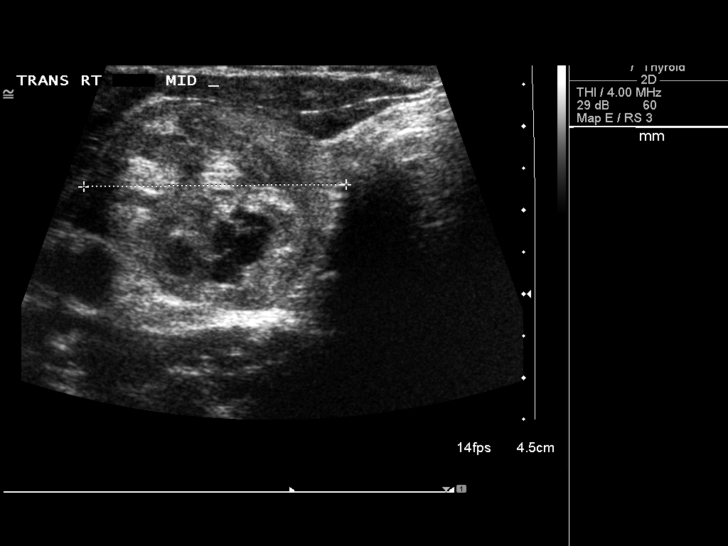
[im 29/53]
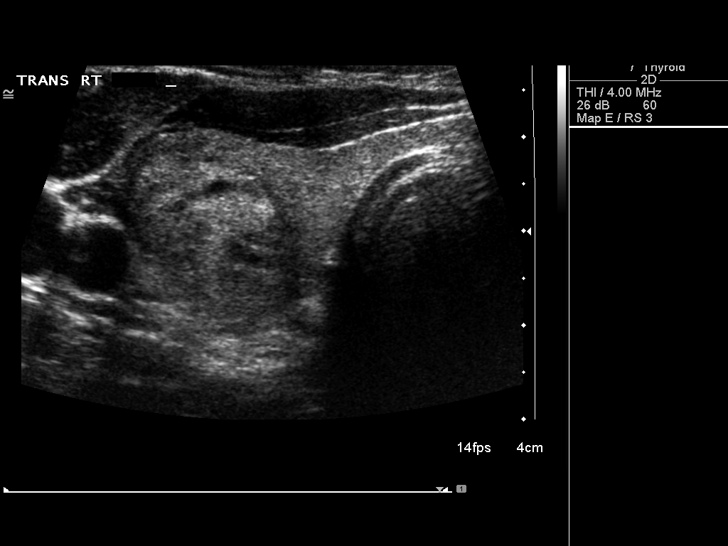
[im 33/53]
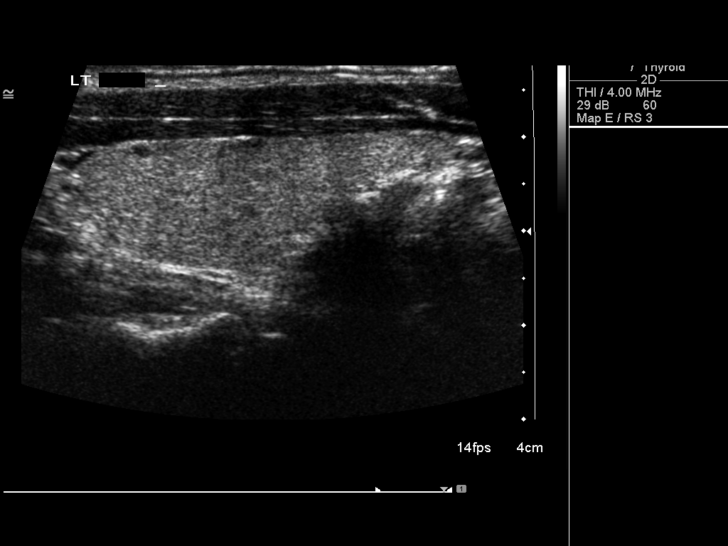
[im 35/53]
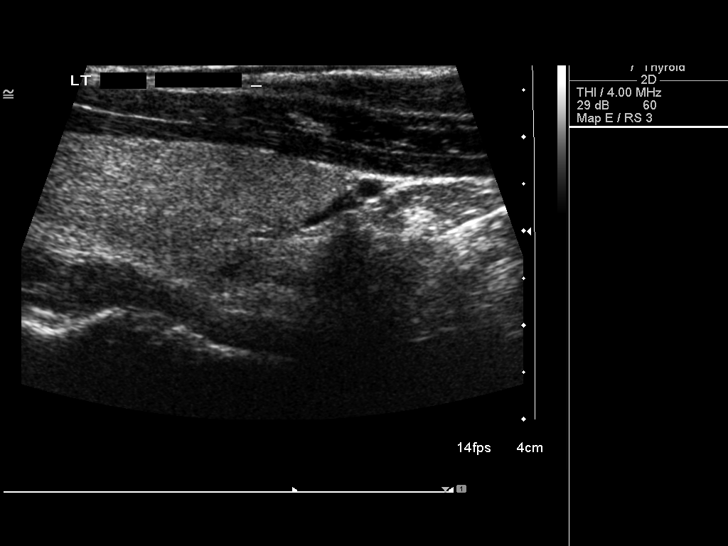
[im 40/53]
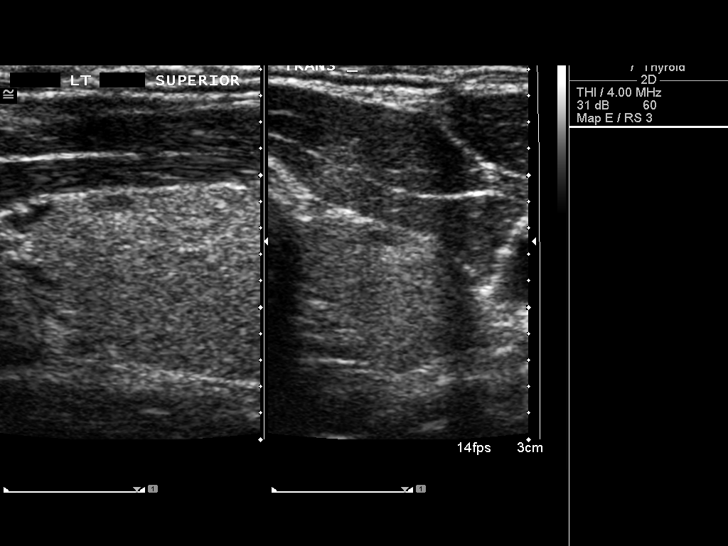
[im 44/53]
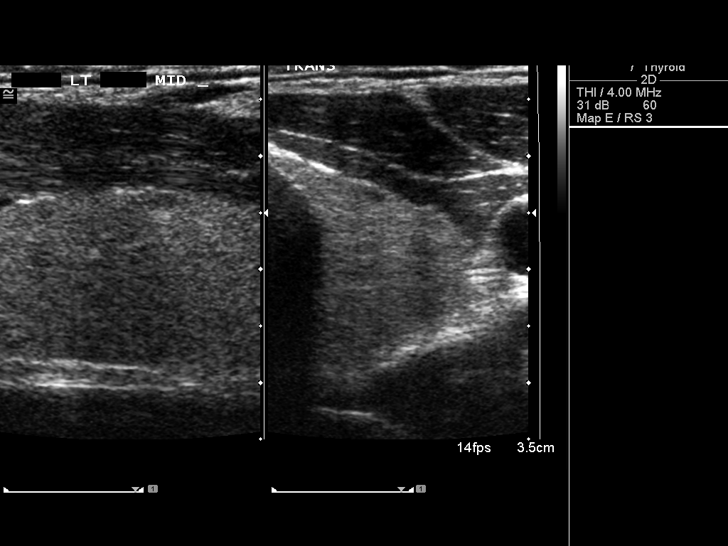
[im 48/53]
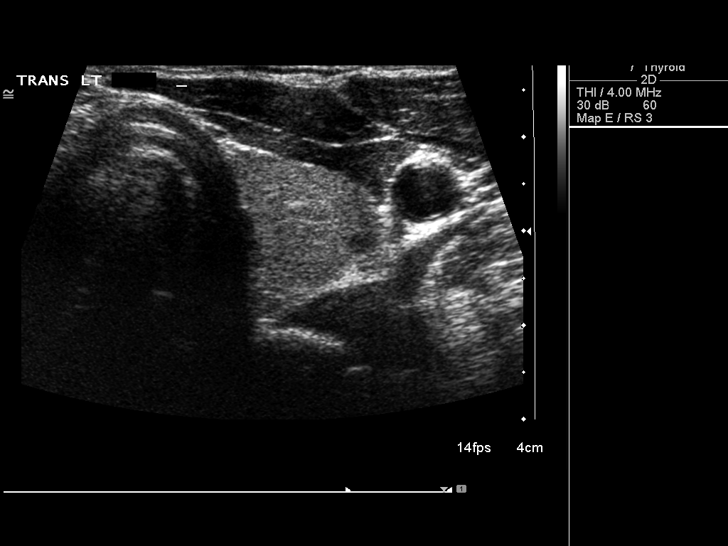
[im 53/53]
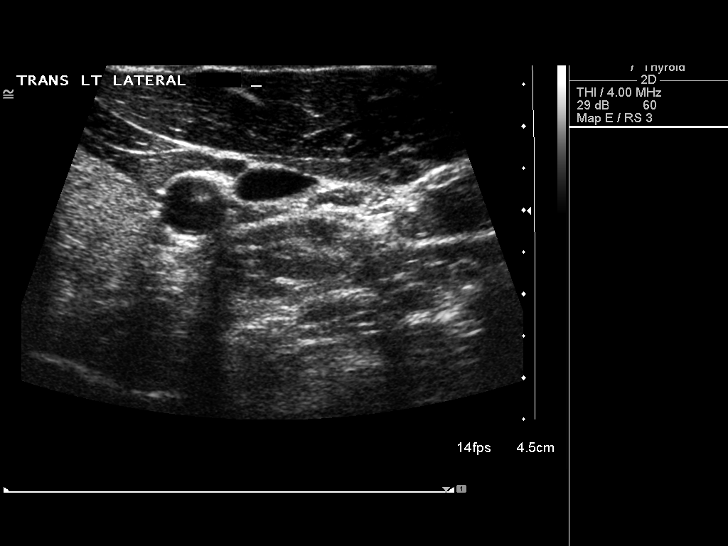

[14 of 25 positions shown; findings below may reference images not displayed]

FINDINGS: Right thyroid lobe:  5.5 x 2.5 x 8-3.1 cm.
Left thyroid lobe:  4.1 x 1.5 x 1.4 cm.
Isthmus:  5 mm

Focal nodules:  Heterogeneous mass within the right thyroid lobe
again noted.  This currently measures 4.4 x 2.6 x 2.5 cm.  Overall
size slightly decreased since prior study (5.7 x 4.2 x 3.3 cm
previously).  It appears that the cystic components of decreased
since prior study.

Lymphadenopathy:  None visualized.
IMPRESSION: Decreased size of the complex solid and cystic mass within the
right thyroid lobe.  The cystic components appear less prominent.

## 2014-09-19 ENCOUNTER — Telehealth: Payer: Self-pay | Admitting: Family Medicine

## 2014-09-19 NOTE — Telephone Encounter (Signed)
Request to clarify order for Valsartan/HCTZ 160-25 mg take 1 po qd and fax back to [facility]. Looks like this is correct dosage, it was changed last year 11/2013. I updated medication list and faxed paperwork with correct dose.

## 2016-03-19 ENCOUNTER — Ambulatory Visit (INDEPENDENT_AMBULATORY_CARE_PROVIDER_SITE_OTHER): Payer: BLUE CROSS/BLUE SHIELD | Admitting: Family Medicine

## 2016-03-19 ENCOUNTER — Ambulatory Visit (INDEPENDENT_AMBULATORY_CARE_PROVIDER_SITE_OTHER): Payer: BLUE CROSS/BLUE SHIELD

## 2016-03-19 VITALS — BP 148/92 | HR 80 | Temp 98.3°F | Resp 18 | Ht 72.0 in | Wt 205.0 lb

## 2016-03-19 DIAGNOSIS — I10 Essential (primary) hypertension: Secondary | ICD-10-CM | POA: Diagnosis not present

## 2016-03-19 DIAGNOSIS — R0600 Dyspnea, unspecified: Secondary | ICD-10-CM

## 2016-03-19 DIAGNOSIS — R7302 Impaired glucose tolerance (oral): Secondary | ICD-10-CM | POA: Diagnosis not present

## 2016-03-19 DIAGNOSIS — R0789 Other chest pain: Secondary | ICD-10-CM | POA: Diagnosis not present

## 2016-03-19 DIAGNOSIS — K219 Gastro-esophageal reflux disease without esophagitis: Secondary | ICD-10-CM | POA: Diagnosis not present

## 2016-03-19 LAB — POCT CBC
Granulocyte percent: 72.6 % (ref 37–80)
HCT, POC: 41.2 % — AB (ref 43.5–53.7)
Hemoglobin: 14.5 g/dL (ref 14.1–18.1)
Lymph, poc: 1.7 (ref 0.6–3.4)
MCH, POC: 29.9 pg (ref 27–31.2)
MCHC: 35.3 g/dL (ref 31.8–35.4)
MCV: 84.7 fL (ref 80–97)
MID (cbc): 0.6 (ref 0–0.9)
MPV: 6.5 fL (ref 0–99.8)
POC Granulocyte: 6.2 (ref 2–6.9)
POC LYMPH PERCENT: 20.3 % (ref 10–50)
POC MID %: 7.1 % (ref 0–12)
Platelet Count, POC: 328 10*3/uL (ref 142–424)
RBC: 4.86 M/uL (ref 4.69–6.13)
RDW, POC: 12.7 %
WBC: 8.6 10*3/uL (ref 4.6–10.2)

## 2016-03-19 LAB — GLUCOSE, POCT (MANUAL RESULT ENTRY): POC Glucose: 113 mg/dL — AB (ref 70–99)

## 2016-03-19 NOTE — Progress Notes (Signed)
Subjective:    Patient ID: Craig Nguyen, male    DOB: Nov 20, 1970, 45 y.o.   MRN: [id number]  03/19/2016  Shortness of Breath; Chest Pain; and Shoulder Pain   HPI This 45 y.o. male presents for evaluation of substernal chest pain, DOE, shoulder tightness B.  Onset of symptoms occurred two weeks ago intermittently.   Substernal chest pain occurs with upper body movements; sitting in certain positions triggers pain.  SOB occurs with certain activities such as brisk walking; also occurs while climbing into tractor.  Exercises regularly so DOE is unusual.  B shoulder tightness and in neck occurs; also occurs in L arm; not sure if due to driving.  No diaphoresis.  Exertion can worsen chest pain.  No nausea.  Decreased appetite at times.  No leg swelling; no calf pain.  Truck driver; rare breaks.  Drives to [facility]; four hours; walks for 30 minutes and then returns.  No heavy lifting other than manipulating steering wheel which is heavy stuff.   Belching a lot; s/p EGD in past two months; started on Nexium six weeks ago; some improvement.  Belching relieves chest pressure/pain.  When eats, feels like food settles in chest.  No choking.  Eats fast.  Has tried to slow down.Even when drinks water, can get stuck in chest.  Anything pt eats, will get caught in chest.  Pain free currently.  Chest pain particularly occurs with holding steering wheel.  New chart.    Glucose intolerance: now controlling with diet.  No longer taking medication. Taking Valsartan.  Followed by Dr. Chestine Spore.  Was taking Kombiglyze; then transitioned to Metformin.  Six months ago, stopped all medications.  Lost 40 pounds.  Omeprazole  daily.    Review of Systems  Constitutional: Negative for fever, chills, diaphoresis, activity change, appetite change and fatigue.  HENT: Negative for congestion, postnasal drip, rhinorrhea and sore throat.   Respiratory: Positive for shortness of breath. Negative for cough, wheezing and stridor.     Cardiovascular: Positive for chest pain. Negative for palpitations and leg swelling.  Gastrointestinal: Negative for nausea, vomiting, abdominal pain, diarrhea, constipation, blood in stool, abdominal distention, anal bleeding and rectal pain.  Endocrine: Negative for cold intolerance, heat intolerance, polydipsia, polyphagia and polyuria.  Skin: Negative for color change, rash and wound.  Neurological: Negative for dizziness, tremors, seizures, syncope, facial asymmetry, speech difficulty, weakness, light-headedness, numbness and headaches.  Psychiatric/Behavioral: Negative for sleep disturbance and dysphoric mood. The patient is not nervous/anxious.     Past Medical History  Diagnosis Date  . Hypertension   . Thyroid nodule     right side & benign  . Diabetes mellitus without complication [facility])    Past Surgical History  Procedure Laterality Date  . Vasectomy    . Colonoscopy  08/05/08    Dr. Loreta Ave, repeat at age 45   Allergies  Allergen Reactions  . Aspirin Swelling   Current Outpatient Prescriptions  Medication Sig Dispense Refill  . Ascorbic Acid (VITAMIN C) 100 MG tablet Take 100 mg by mouth daily.    . Cyanocobalamin (VITAMIN B 12 PO) Take 1 tablet by mouth daily.     . Omega-3 Fatty Acids (FISH OIL) 1000 MG CAPS Take by mouth.    . valsartan-hydrochlorothiazide (DIOVAN-HCT) 160-25 MG per tablet Take 1 tablet by mouth daily. 90 tablet 0  . Insulin Glargine (LANTUS) 100 UNIT/ML Solostar Pen Inject 20 Units into the skin daily at 10 pm. Reported on 03/19/2016    .  insulin lispro (HUMALOG) 100 UNIT/ML KiwkPen Inject 10 Units into the skin 3 (three) times daily. Reported on 03/19/2016    . Potassium Aminobenzoate 500 MG CAPS Take by mouth. Reported on 03/19/2016    . Saxagliptin-Metformin (KOMBIGLYZE XR) 2.03-999 MG TB24 Take by mouth. Reported on 03/19/2016    . Zinc 50 MG CAPS Take by mouth. Reported on 03/19/2016     No current facility-administered medications for this visit.    Social History   Social History  . Marital Status: Married    Spouse Name: N/A  . Number of Children: N/A  . Years of Education: N/A   Occupational History  . Not on file.   Social History Main Topics  . Smoking status: Never Smoker   . Smokeless tobacco: Never Used  . Alcohol Use: 3.6 oz/week    6 Cans of beer per week     Comment: occ  . Drug Use: No  . Sexual Activity: Not on file   Other Topics Concern  . Not on file   Social History Narrative   Family History  Problem Relation Age of Onset  . Diabetes    . Hypertension    . Prostate cancer    . Sarcoidosis      pulmonary, sister  . Diabetes Mother   . Hypertension Mother   . Diabetes Father   . Hypertension Father   . Heart disease Father   . Diabetes Brother   . Hypertension Brother        Objective:    BP 148/92 mmHg  Pulse 80  Temp(Src) 98.3 F (36.8 C) (Oral)  Resp 18  Ht 6' (1.829 m)  Wt 205 lb (92.987 kg)  BMI 27.80 kg/m2  SpO2 97% Physical Exam  Constitutional: He is oriented to person, place, and time. He appears well-developed and well-nourished. No distress.  HENT:  Head: Normocephalic and atraumatic.  Right Ear: External ear normal.  Left Ear: External ear normal.  Nose: Nose normal.  Mouth/Throat: Oropharynx is clear and moist.  Eyes: Conjunctivae and EOM are normal. Pupils are equal, round, and reactive to light.  Neck: Normal range of motion. Neck supple. Carotid bruit is not present. No thyromegaly present.  Cardiovascular: Normal rate, regular rhythm, normal heart sounds and intact distal pulses.  Exam reveals no gallop and no friction rub.   No murmur heard. Pulmonary/Chest: Effort normal and breath sounds normal. No respiratory distress. He has no wheezes. He has no rales. He exhibits no tenderness.  Unable to reproduce chest pain with palpation or with range of motion.  Abdominal: Soft. Bowel sounds are normal. He exhibits no distension and no mass. There is no tenderness.  There is no rebound and no guarding.  Musculoskeletal:       Right shoulder: Normal. He exhibits normal range of motion, no tenderness and no pain.       Left shoulder: Normal. He exhibits normal range of motion, no tenderness and no pain.       Cervical back: Normal. He exhibits normal range of motion, no tenderness and no pain.       Thoracic back: Normal. He exhibits normal range of motion, no tenderness and no pain.       Lumbar back: Normal. He exhibits normal range of motion, no tenderness and no pain.  Lymphadenopathy:    He has no cervical adenopathy.  Neurological: He is alert and oriented to person, place, and time. No cranial nerve deficit.  Skin: Skin is warm  and dry. No rash noted. He is not diaphoretic.  Psychiatric: He has a normal mood and affect. His behavior is normal.  Nursing note and vitals reviewed.  Results for orders placed or performed in visit on 03/19/16  POCT CBC  Result Value Ref Range   WBC 8.6 4.6 - 10.2 K/uL   Lymph, poc 1.7 0.6 - 3.4   POC LYMPH PERCENT 20.3 10 - 50 %L   MID (cbc) 0.6 0 - 0.9   POC MID % 7.1 0 - 12 %M   POC Granulocyte 6.2 2 - 6.9   Granulocyte percent 72.6 37 - 80 %G   RBC 4.86 4.69 - 6.13 M/uL   Hemoglobin 14.5 14.1 - 18.1 g/dL   HCT, POC 16.141.2 (A) 09.643.5 - 53.7 %   MCV 84.7 80 - 97 fL   MCH, POC 29.9 27 - 31.2 pg   MCHC 35.3 31.8 - 35.4 g/dL   RDW, POC 04.512.7 %   Platelet Count, POC 328 142 - 424 K/uL   MPV 6.5 0 - 99.8 fL  POCT glucose (manual entry)  Result Value Ref Range   POC Glucose 113 (A) 70 - 99 mg/dl   Dg Chest 2 View  4/09/81195/10/2016  CLINICAL DATA:  Substernal chest pain.  Dyspnea. EXAM: CHEST  2 VIEW COMPARISON:  None. FINDINGS: The heart size and mediastinal contours are within normal limits. Both lungs are clear. The visualized skeletal structures are unremarkable. IMPRESSION: No active cardiopulmonary disease. Electronically Signed   By: Myles RosenthalJohn  Stahl M.D.   On: 03/19/2016 18:31   EKG: NSR; no ST changes.    Assessment  & Plan:   1. Other chest pain   2. Dyspnea   3. Gastroesophageal reflux disease without esophagitis   4. Glucose intolerance (impaired glucose tolerance)   5. Essential hypertension, benign    -New. 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 Very atypical for cardiac etiology; pt declined referral to cardiology at this time; plans to change trucks at work.   -recommend ED visit if acutely worsens. -obtain labs. 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 -has multiple cardiac risk factors including HTN, DMII,yet patient does not desire cardiology consultation at this time and agreeable. If persists despite work changes, will warrant cardiology consultation; pt expressed understanding.   Orders Placed This Encounter  Procedures  . DG Chest 2 View    Standing Status: Future     Number of Occurrences: 1     Standing Expiration Date: 03/19/2017    Order Specific Question:  Reason for Exam (SYMPTOM  OR DIAGNOSIS REQUIRED)    Answer:  DOE, substernal chest pain, GERD, HTN, glucose intolerance    Order Specific Question:  Preferred imaging location?    Answer:  External  . Comprehensive metabolic panel  . POCT CBC  . POCT glucose (manual entry)  . EKG 12-Lead   No orders of the defined types were placed in this encounter.    No Follow-up on file.    Levante Simones Paulita FujitaMartin Lilith Solana, M.D. [facility]  [place] [address] [place], [place]  [id number] [phone] phone [phone]) [phone] fax

## 2016-03-19 NOTE — Patient Instructions (Addendum)
1.  Continue Omeprazole 40mg  one tablet upon awakening. 2.  Start Zantac/Ranitidine 150mg  prior to bedtime.     IF you received an x-ray today, you will receive an invoice from [facility]. Please contact [facility] at [phone] with questions or concerns regarding your invoice.   IF you received labwork today, you will receive an invoice from [facility]. Please contact [facility] at [phone] with questions or concerns regarding your invoice.   Our billing staff will not be able to assist you with questions regarding bills from these companies.  You will be contacted with the lab results as soon as they are available. The fastest way to get your results is to activate your My Chart account. Instructions are located on the last page of this paperwork. If you have not heard from us regarding the results in 2 weeks, please contact this office.

## 2016-03-20 LAB — COMPREHENSIVE METABOLIC PANEL
ALT: 11 U/L (ref 9–46)
AST: 21 U/L (ref 10–40)
Albumin: 4.5 g/dL (ref 3.6–5.1)
Alkaline Phosphatase: 71 U/L (ref 40–115)
BUN: 11 mg/dL (ref 7–25)
CO2: 27 mmol/L (ref 20–31)
Calcium: 9.7 mg/dL (ref 8.6–10.3)
Chloride: 103 mmol/L (ref 98–110)
Creat: 0.99 mg/dL (ref 0.60–1.35)
Glucose, Bld: 104 mg/dL — ABNORMAL HIGH (ref 65–99)
Potassium: 4.6 mmol/L (ref 3.5–5.3)
Sodium: 139 mmol/L (ref 135–146)
Total Bilirubin: 0.7 mg/dL (ref 0.2–1.2)
Total Protein: 7.4 g/dL (ref 6.1–8.1)

## 2016-04-11 ENCOUNTER — Encounter: Payer: Self-pay | Admitting: Family Medicine

## 2018-01-23 LAB — BASIC METABOLIC PANEL
BUN: 10 (ref 4–21)
Creatinine: 1 (ref 0.6–1.3)
Glucose: 133
Potassium: 4.1 (ref 3.4–5.3)
Sodium: 138 (ref 137–147)

## 2018-03-30 ENCOUNTER — Encounter: Payer: Self-pay | Admitting: Family Medicine

## 2018-04-16 IMAGING — CR DG CHEST 2V
2 series · 2 of 2 positions shown · non-contrast
Comparison: None.

CLINICAL DATA: Substernal chest pain.  Dyspnea.

EXAM:
CHEST  2 VIEW

[PA]
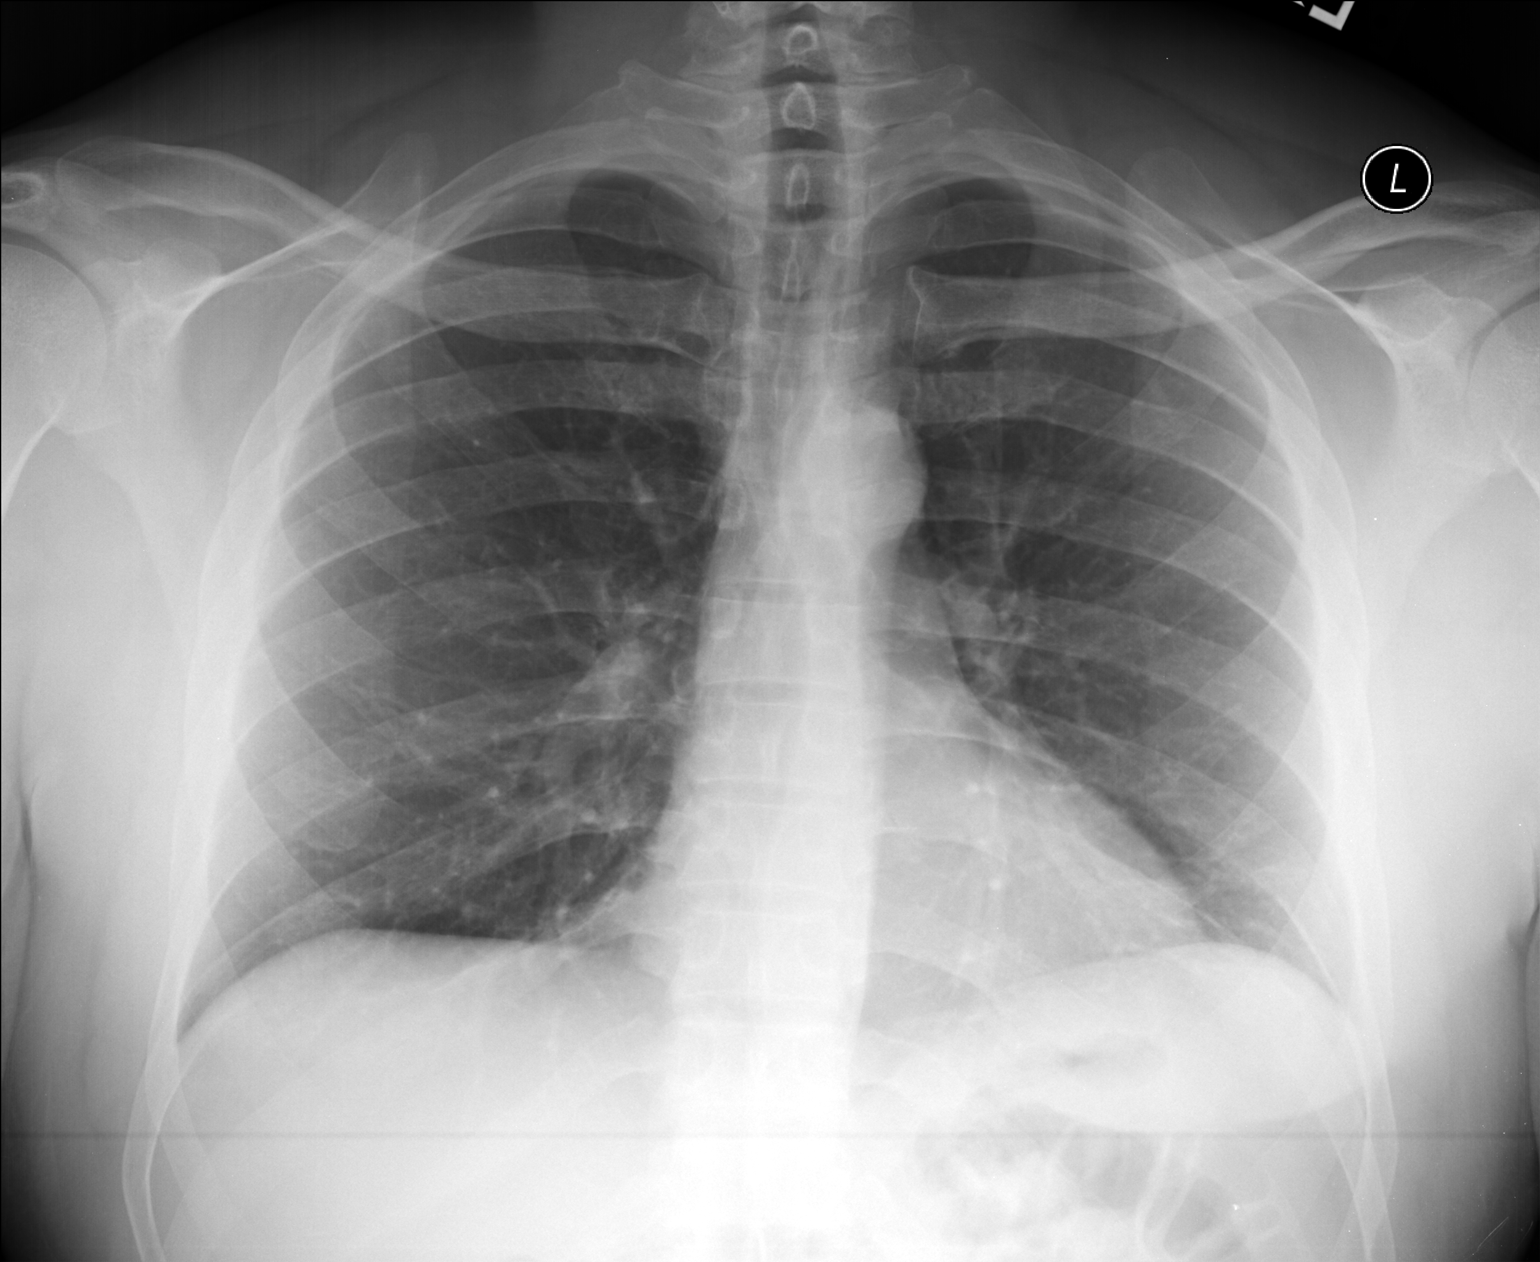

[lateral]
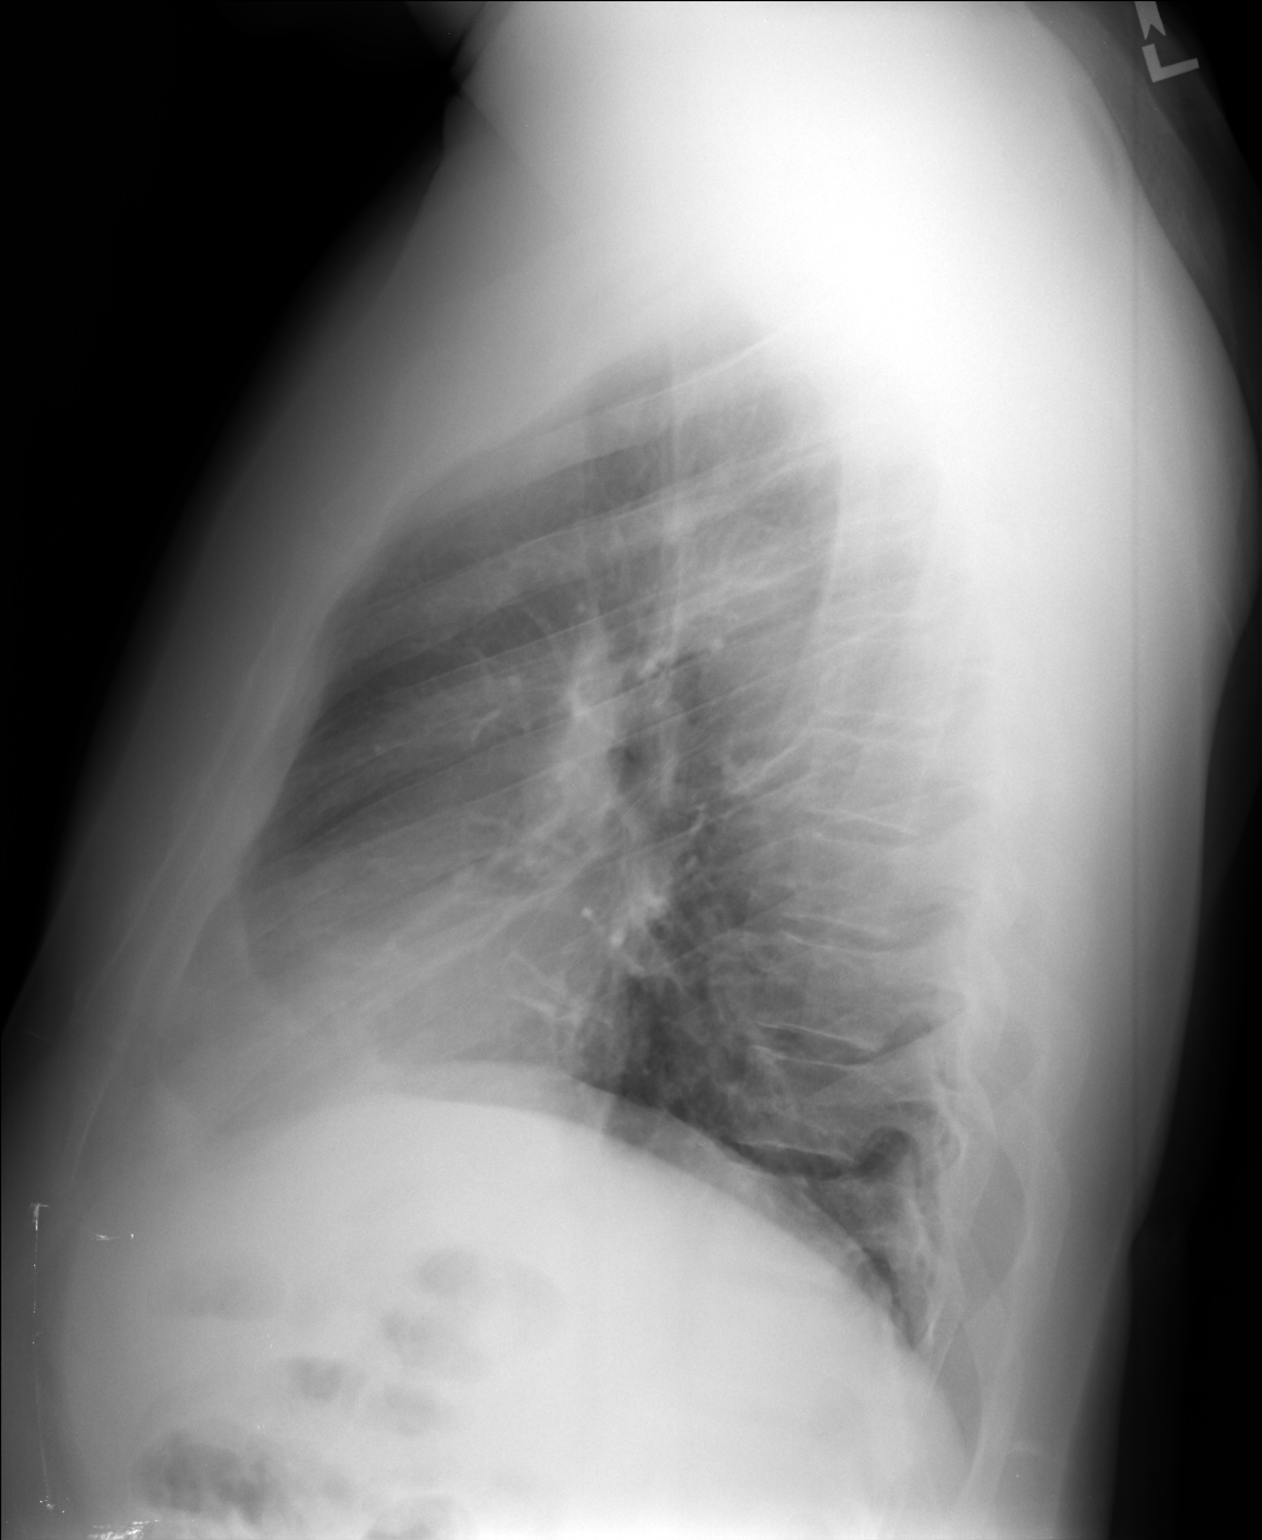

[2 of 2 positions shown; findings below may reference images not displayed]

FINDINGS: The heart size and mediastinal contours are within normal limits.
Both lungs are clear. The visualized skeletal structures are
unremarkable.
IMPRESSION: No active cardiopulmonary disease.

## 2018-07-06 DIAGNOSIS — Z6831 Body mass index (BMI) 31.0-31.9, adult: Secondary | ICD-10-CM | POA: Insufficient documentation

## 2018-08-19 ENCOUNTER — Encounter: Payer: Self-pay | Admitting: Internal Medicine

## 2018-08-19 DIAGNOSIS — Z6831 Body mass index (BMI) 31.0-31.9, adult: Secondary | ICD-10-CM

## 2018-08-21 ENCOUNTER — Other Ambulatory Visit: Payer: Self-pay

## 2018-08-21 ENCOUNTER — Encounter: Payer: Self-pay | Admitting: Internal Medicine

## 2018-08-21 ENCOUNTER — Ambulatory Visit (INDEPENDENT_AMBULATORY_CARE_PROVIDER_SITE_OTHER): Payer: Managed Care, Other (non HMO) | Admitting: Internal Medicine

## 2018-08-21 VITALS — BP 140/86 | HR 94 | Temp 98.6°F | Ht 70.5 in | Wt 223.6 lb

## 2018-08-21 DIAGNOSIS — I10 Essential (primary) hypertension: Secondary | ICD-10-CM | POA: Diagnosis not present

## 2018-08-21 DIAGNOSIS — Z0000 Encounter for general adult medical examination without abnormal findings: Secondary | ICD-10-CM

## 2018-08-21 DIAGNOSIS — Z23 Encounter for immunization: Secondary | ICD-10-CM | POA: Diagnosis not present

## 2018-08-21 LAB — POCT URINALYSIS DIPSTICK
Bilirubin, UA: NEGATIVE
Blood, UA: NEGATIVE
Glucose, UA: NEGATIVE
Ketones, UA: NEGATIVE
Leukocytes, UA: NEGATIVE
Nitrite, UA: NEGATIVE
Protein, UA: NEGATIVE
Spec Grav, UA: 1.01 (ref 1.010–1.025)
Urobilinogen, UA: 0.2 E.U./dL
pH, UA: 6.5 (ref 5.0–8.0)

## 2018-08-21 LAB — POCT UA - MICROALBUMIN
Albumin/Creatinine Ratio, Urine, POC: <30
Creatinine, POC: 100 mg/dL
Microalbumin Ur, POC: 10 mg/L

## 2018-08-21 MED ORDER — AMLODIPINE BESYLATE-VALSARTAN 5-160 MG PO TABS: 1.0000 | ORAL_TABLET | Freq: Every day | ORAL | 1 refills | Status: DC

## 2018-08-21 NOTE — Patient Instructions (Signed)
DASH Eating Plan DASH stands for "Dietary Approaches to Stop Hypertension." The DASH eating plan is a healthy eating plan that has been shown to reduce high blood pressure (hypertension). It may also reduce your risk for type 2 diabetes, heart disease, and stroke. The DASH eating plan may also help with weight loss. What are tips for following this plan? General guidelines  Avoid eating more than 2,300 mg (milligrams) of salt (sodium) a day. If you have hypertension, you may need to reduce your sodium intake to 1,500 mg a day.  Limit alcohol intake to no more than 1 drink a day for nonpregnant women and 2 drinks a day for men. One drink equals 12 oz of beer, 5 oz of wine, or 1 oz of hard liquor.  Work with your health care provider to maintain a healthy body weight or to lose weight. Ask what an ideal weight is for you.  Get at least 30 minutes of exercise that causes your heart to beat faster (aerobic exercise) most days of the week. Activities may include walking, swimming, or biking.  Work with your health care provider or diet and nutrition specialist (dietitian) to adjust your eating plan to your individual calorie needs. Reading food labels  Check food labels for the amount of sodium per serving. Choose foods with less than 5 percent of the Daily Value of sodium. Generally, foods with less than 300 mg of sodium per serving fit into this eating plan.  To find whole grains, look for the word "whole" as the first word in the ingredient list. Shopping  Buy products labeled as "low-sodium" or "no salt added."  Buy fresh foods. Avoid canned foods and premade or frozen meals. Cooking  Avoid adding salt when cooking. Use salt-free seasonings or herbs instead of table salt or sea salt. Check with your health care provider or pharmacist before using salt substitutes.  Do not fry foods. Cook foods using healthy methods such as baking, boiling, grilling, and broiling instead.  Cook with  heart-healthy oils, such as olive, canola, soybean, or sunflower oil. Meal planning   Eat a balanced diet that includes: ? 5 or more servings of fruits and vegetables each day. At each meal, try to fill half of your plate with fruits and vegetables. ? Up to 6-8 servings of whole grains each day. ? Less than 6 oz of lean meat, poultry, or fish each day. A 3-oz serving of meat is about the same size as a deck of cards. One egg equals 1 oz. ? 2 servings of low-fat dairy each day. ? A serving of nuts, seeds, or beans 5 times each week. ? Heart-healthy fats. Healthy fats called Omega-3 fatty acids are found in foods such as flaxseeds and coldwater fish, like sardines, salmon, and mackerel.  Limit how much you eat of the following: ? Canned or prepackaged foods. ? Food that is high in trans fat, such as fried foods. ? Food that is high in saturated fat, such as fatty meat. ? Sweets, desserts, sugary drinks, and other foods with added sugar. ? Full-fat dairy products.  Do not salt foods before eating.  Try to eat at least 2 vegetarian meals each week.  Eat more home-cooked food and less restaurant, buffet, and fast food.  When eating at a restaurant, ask that your food be prepared with less salt or no salt, if possible. What foods are recommended? The items listed may not be a complete list. Talk with your dietitian about what   dietary choices are best for you. Grains Whole-grain or whole-wheat bread. Whole-grain or whole-wheat pasta. Brown rice. Oatmeal. Quinoa. Bulgur. Whole-grain and low-sodium cereals. Pita bread. Low-fat, low-sodium crackers. Whole-wheat flour tortillas. Vegetables Fresh or frozen vegetables (raw, steamed, roasted, or grilled). Low-sodium or reduced-sodium tomato and vegetable juice. Low-sodium or reduced-sodium tomato sauce and tomato paste. Low-sodium or reduced-sodium canned vegetables. Fruits All fresh, dried, or frozen fruit. Canned fruit in natural juice (without  added sugar). Meat and other protein foods Skinless chicken or turkey. Ground chicken or turkey. Pork with fat trimmed off. Fish and seafood. Egg whites. Dried beans, peas, or lentils. Unsalted nuts, nut butters, and seeds. Unsalted canned beans. Lean cuts of beef with fat trimmed off. Low-sodium, lean deli meat. Dairy Low-fat (1%) or fat-free (skim) milk. Fat-free, low-fat, or reduced-fat cheeses. Nonfat, low-sodium ricotta or cottage cheese. Low-fat or nonfat yogurt. Low-fat, low-sodium cheese. Fats and oils Soft margarine without trans fats. Vegetable oil. Low-fat, reduced-fat, or light mayonnaise and salad dressings (reduced-sodium). Canola, safflower, olive, soybean, and sunflower oils. Avocado. Seasoning and other foods Herbs. Spices. Seasoning mixes without salt. Unsalted popcorn and pretzels. Fat-free sweets. What foods are not recommended? The items listed may not be a complete list. Talk with your dietitian about what dietary choices are best for you. Grains Baked goods made with fat, such as croissants, muffins, or some breads. Dry pasta or rice meal packs. Vegetables Creamed or fried vegetables. Vegetables in a cheese sauce. Regular canned vegetables (not low-sodium or reduced-sodium). Regular canned tomato sauce and paste (not low-sodium or reduced-sodium). Regular tomato and vegetable juice (not low-sodium or reduced-sodium). Pickles. Olives. Fruits Canned fruit in a light or heavy syrup. Fried fruit. Fruit in cream or butter sauce. Meat and other protein foods Fatty cuts of meat. Ribs. Fried meat. Bacon. Sausage. Bologna and other processed lunch meats. Salami. Fatback. Hotdogs. Bratwurst. Salted nuts and seeds. Canned beans with added salt. Canned or smoked fish. Whole eggs or egg yolks. Chicken or turkey with skin. Dairy Whole or 2% milk, cream, and half-and-half. Whole or full-fat cream cheese. Whole-fat or sweetened yogurt. Full-fat cheese. Nondairy creamers. Whipped toppings.  Processed cheese and cheese spreads. Fats and oils Butter. Stick margarine. Lard. Shortening. Ghee. Bacon fat. Tropical oils, such as coconut, palm kernel, or palm oil. Seasoning and other foods Salted popcorn and pretzels. Onion salt, garlic salt, seasoned salt, table salt, and sea salt. Worcestershire sauce. Tartar sauce. Barbecue sauce. Teriyaki sauce. Soy sauce, including reduced-sodium. Steak sauce. Canned and packaged gravies. Fish sauce. Oyster sauce. Cocktail sauce. Horseradish that you find on the shelf. Ketchup. Mustard. Meat flavorings and tenderizers. Bouillon cubes. Hot sauce and Tabasco sauce. Premade or packaged marinades. Premade or packaged taco seasonings. Relishes. Regular salad dressings. Where to find more information:  National Heart, Lung, and Blood Institute: www.nhlbi.nih.gov  American Heart Association: www.heart.org Summary  The DASH eating plan is a healthy eating plan that has been shown to reduce high blood pressure (hypertension). It may also reduce your risk for type 2 diabetes, heart disease, and stroke.  With the DASH eating plan, you should limit salt (sodium) intake to 2,300 mg a day. If you have hypertension, you may need to reduce your sodium intake to 1,500 mg a day.  When on the DASH eating plan, aim to eat more fresh fruits and vegetables, whole grains, lean proteins, low-fat dairy, and heart-healthy fats.  Work with your health care provider or diet and nutrition specialist (dietitian) to adjust your eating plan to your individual   calorie needs. This information is not intended to replace advice given to you by your health care provider. Make sure you discuss any questions you have with your health care provider. Document Released: 10/14/2011 Document Revised: 10/18/2016 Document Reviewed: 10/18/2016 Elsevier Interactive Patient Education  2018 [employer].  

## 2018-08-21 NOTE — Progress Notes (Signed)
Subjective:     Patient ID: Craig Nguyen , male    DOB: 05-05-1971 , 47 y.o.   MRN: [id number]   HE IS HERE TODAY FOR A FULL PHYSICAL EXAM. HE HAS NO SPECIFIC CONCERNS OR COMPLAINTS AT THIS TIME. HE ADMITS HE HAS NOT BEEN EXERCISING AS MUCH DUE TO BUSY WORK SCHEDULE.     Past Medical History:  Diagnosis Date  . Diabetes mellitus without complication ([place])   . Hypertension   . Thyroid nodule    right side & benign      Current Outpatient Medications:  .  amLODipine-valsartan (EXFORGE) 5-160 MG tablet, Take 1 tablet by mouth daily., Disp: , Rfl:  .  Coenzyme Q10-Vitamin E (QUNOL ULTRA COQ10 PO), Take by mouth., Disp: , Rfl:  .  Omega-3 Fatty Acids (FISH OIL) 1000 MG CAPS, Take by mouth., Disp: , Rfl:    Allergies  Allergen Reactions  . Aspirin Swelling     Review of Systems  Constitutional: Negative.   HENT: Negative.   Eyes: Negative.   Respiratory: Negative.   Cardiovascular: Negative.   Gastrointestinal: Negative.   Endocrine: Negative.   Genitourinary: Negative.   Skin: Negative.   Neurological: Negative.   Psychiatric/Behavioral: Negative.      Today's Vitals   08/21/18 0956  BP: 140/86  Pulse: 94  Temp: 98.6 F (37 C)  TempSrc: Oral  Weight: 223 lb 9.6 oz (101.4 kg)  Height: 5' 10.5" (1.791 m)   Body mass index is 31.63 kg/m.   Objective:  Physical Exam  Constitutional: He is oriented to person, place, and time. He appears well-developed and well-nourished.  HENT:  Head: Normocephalic and atraumatic.  Right Ear: External ear normal.  Left Ear: External ear normal.  Nose: Nose normal.  Mouth/Throat: Oropharynx is clear and moist.  Eyes: Pupils are equal, round, and reactive to light. Conjunctivae and EOM are normal.  Neck: Normal range of motion. Neck supple.  Cardiovascular: Normal rate, regular rhythm, normal heart sounds and intact distal pulses.  Pulmonary/Chest: Effort normal and breath sounds normal.  Abdominal: Soft. Bowel sounds are  normal.  Genitourinary: Rectum normal and prostate normal. Rectal exam shows guaiac negative stool.  Musculoskeletal: Normal range of motion.  Neurological: He is alert and oriented to person, place, and time.  Skin: Skin is warm and dry.  Psychiatric: He has a normal mood and affect.  Nursing note and vitals reviewed.       Assessment And Plan:     1. Routine general medical examination at health care facility  A FULL EXAM WAS PERFORMED. DRE PERFORMED, STOOL HEME NEGATIVE.  PATIENT HAS BEEN ADVISED TO GET 30-45 MINUTES REGULAR EXERCISE NO LESS THAN FOUR TO FIVE DAYS PER WEEK - BOTH WEIGHTBEARING EXERCISES AND AEROBIC ARE RECOMMENDED.  HE IS ADVISED TO FOLLOW A HEALTHY DIET WITH AT LEAST SIX FRUITS/VEGGIES PER DAY, DECREASE INTAKE OF RED MEAT, AND TO INCREASE FISH INTAKE TO TWO DAYS PER WEEK.  MEATS/FISH SHOULD NOT BE FRIED, BAKED OR BROILED IS PREFERABLE.  I SUGGEST WEARING SPF 50 SUNSCREEN ON EXPOSED PARTS AND ESPECIALLY WHEN IN THE DIRECT SUNLIGHT FOR AN EXTENDED PERIOD OF TIME.  PLEASE AVOID FAST FOOD RESTAURANTS AND INCREASE YOUR WATER INTAKE.  - CBC - TSH - Lipid panel - Hemoglobin A1c - CMP14+EGFR - PSA  2. Essential hypertension, benign  ELEVATED TODAY. HE IS ENCOURAGED TO INCORPORATE MORE EXERCISE INTO HIS DAILY ROUTINE. EKG PERFORMED, LVH IS PRESENT. IMPORTANCE OF OPTIMAL BP CONTROL WAS DISCUSSED WITH THE  PATIENT. HE WILL RTO IN SIX MONTHS FOR RE-EVALUATION.   - EKG 12-Lead  3. Need for prophylactic vaccination and inoculation against influenza  HE WAS GIVEN FLU VACCINE.     Maximino Greenland, MD

## 2018-08-22 LAB — CMP14+EGFR
ALT: 18 IU/L (ref 0–44)
AST: 25 IU/L (ref 0–40)
Albumin/Globulin Ratio: 1.5 (ref 1.2–2.2)
Albumin: 4.7 g/dL (ref 3.5–5.5)
Alkaline Phosphatase: 90 IU/L (ref 39–117)
BUN/Creatinine Ratio: 11 (ref 9–20)
BUN: 12 mg/dL (ref 6–24)
Bilirubin Total: 0.8 mg/dL (ref 0.0–1.2)
CO2: 24 mmol/L (ref 20–29)
Calcium: 9.8 mg/dL (ref 8.7–10.2)
Chloride: 100 mmol/L (ref 96–106)
Creatinine, Ser: 1.14 mg/dL (ref 0.76–1.27)
GFR calc Af Amer: 87 mL/min/{1.73_m2} (ref >59)
GFR calc non Af Amer: 75 mL/min/{1.73_m2} (ref >59)
Globulin, Total: 3.2 g/dL (ref 1.5–4.5)
Glucose: 110 mg/dL — ABNORMAL HIGH (ref 65–99)
Potassium: 4.9 mmol/L (ref 3.5–5.2)
Sodium: 139 mmol/L (ref 134–144)
Total Protein: 7.9 g/dL (ref 6.0–8.5)

## 2018-08-22 LAB — LIPID PANEL
Chol/HDL Ratio: 3.3 ratio (ref 0.0–5.0)
Cholesterol, Total: 164 mg/dL (ref 100–199)
HDL: 50 mg/dL (ref >39)
LDL Calculated: 99 mg/dL (ref 0–99)
Triglycerides: 75 mg/dL (ref 0–149)
VLDL Cholesterol Cal: 15 mg/dL (ref 5–40)

## 2018-08-22 LAB — CBC
Hematocrit: 45.1 % (ref 37.5–51.0)
Hemoglobin: 15 g/dL (ref 13.0–17.7)
MCH: 29.1 pg (ref 26.6–33.0)
MCHC: 33.3 g/dL (ref 31.5–35.7)
MCV: 88 fL (ref 79–97)
Platelets: 346 10*3/uL (ref 150–450)
RBC: 5.15 x10E6/uL (ref 4.14–5.80)
RDW: 12.2 % — ABNORMAL LOW (ref 12.3–15.4)
WBC: 7.7 10*3/uL (ref 3.4–10.8)

## 2018-08-22 LAB — HEMOGLOBIN A1C
Est. average glucose Bld gHb Est-mCnc: 114 mg/dL
Hgb A1c MFr Bld: 5.6 % (ref 4.8–5.6)

## 2018-08-22 LAB — PSA: Prostate Specific Ag, Serum: 0.3 ng/mL (ref 0.0–4.0)

## 2018-08-22 LAB — TSH: TSH: 1.71 u[IU]/mL (ref 0.450–4.500)

## 2018-08-23 NOTE — Progress Notes (Signed)
Here are your lab results:  Your blood count is normal.  Your thyroid function is normal. Your cholesterol is great. You are not prediabetic. Your liver and kidney function are normal. Your Psa, prostate test, is within normal limits.  Please let me know if you have any questions.    Sincerely,    Brantlee Hinde N. Allyne Gee, MD

## 2019-01-08 ENCOUNTER — Other Ambulatory Visit: Payer: Self-pay | Admitting: Nurse Practitioner

## 2019-02-20 ENCOUNTER — Encounter: Payer: Self-pay | Admitting: Internal Medicine

## 2019-02-26 ENCOUNTER — Ambulatory Visit: Payer: Managed Care, Other (non HMO) | Admitting: Internal Medicine

## 2019-06-18 ENCOUNTER — Other Ambulatory Visit: Payer: Self-pay | Admitting: Nurse Practitioner

## 2019-09-03 ENCOUNTER — Encounter: Payer: Managed Care, Other (non HMO) | Admitting: Internal Medicine

## 2019-09-05 ENCOUNTER — Other Ambulatory Visit: Payer: Self-pay | Admitting: Internal Medicine

## 2019-09-10 ENCOUNTER — Encounter: Payer: Managed Care, Other (non HMO) | Admitting: Internal Medicine

## 2019-10-25 ENCOUNTER — Ambulatory Visit (INDEPENDENT_AMBULATORY_CARE_PROVIDER_SITE_OTHER): Payer: Managed Care, Other (non HMO) | Admitting: Internal Medicine

## 2019-10-25 ENCOUNTER — Encounter: Payer: Self-pay | Admitting: Internal Medicine

## 2019-10-25 ENCOUNTER — Other Ambulatory Visit: Payer: Self-pay

## 2019-10-25 VITALS — BP 118/76 | HR 94 | Temp 98.0°F | Ht 70.5 in | Wt 220.6 lb

## 2019-10-25 DIAGNOSIS — Z23 Encounter for immunization: Secondary | ICD-10-CM | POA: Diagnosis not present

## 2019-10-25 DIAGNOSIS — I10 Essential (primary) hypertension: Secondary | ICD-10-CM | POA: Diagnosis not present

## 2019-10-25 DIAGNOSIS — Z0000 Encounter for general adult medical examination without abnormal findings: Secondary | ICD-10-CM

## 2019-10-25 DIAGNOSIS — Z1211 Encounter for screening for malignant neoplasm of colon: Secondary | ICD-10-CM

## 2019-10-25 LAB — POCT URINALYSIS DIPSTICK
Bilirubin, UA: NEGATIVE
Blood, UA: NEGATIVE
Glucose, UA: NEGATIVE
Ketones, UA: NEGATIVE
Leukocytes, UA: NEGATIVE
Nitrite, UA: NEGATIVE
Protein, UA: NEGATIVE
Spec Grav, UA: 1.01 (ref 1.010–1.025)
Urobilinogen, UA: 0.2 E.U./dL
pH, UA: 5 (ref 5.0–8.0)

## 2019-10-25 LAB — POCT UA - MICROALBUMIN
Albumin/Creatinine Ratio, Urine, POC: 30
Creatinine, POC: 50 mg/dL
Microalbumin Ur, POC: 10 mg/L

## 2019-10-25 NOTE — Progress Notes (Signed)
This visit occurred during the SARS-CoV-2 public health emergency.  Safety protocols were in place, including screening questions prior to the visit, additional usage of staff PPE, and extensive cleaning of exam room while observing appropriate contact time as indicated for disinfecting solutions.  Subjective:     Patient ID: Craig Nguyen , male    DOB: [birth year] , 48 y.o.   MRN: [id number]   Chief Complaint  Patient presents with  . Annual Exam  . Hypertension    HPI  He is here today for a full physical examination. He has no specific concerns or complaints at this time.   Hypertension This is a chronic problem. The current episode started more than 1 year ago. The problem has been gradually improving since onset. The problem is controlled. Pertinent negatives include no blurred vision, chest pain, palpitations or shortness of breath. Past treatments include angiotensin blockers and calcium channel blockers. The current treatment provides moderate improvement. Compliance problems include exercise.      Past Medical History:  Diagnosis Date  . Diabetes mellitus without complication ([place])   . Hypertension   . Thyroid nodule    right side & benign     Family History  Problem Relation Age of Onset  . Diabetes Mother   . Hypertension Mother   . Kidney failure Mother   . Diabetes Father   . Hypertension Father   . Heart disease Father   . Diabetes Other   . Hypertension Other   . Prostate cancer Other   . Sarcoidosis Other        pulmonary, sister  . Diabetes Brother   . Hypertension Brother      Current Outpatient Medications:  .  amLODipine-valsartan (EXFORGE) 5-160 MG tablet, TAKE 1 TABLET BY MOUTH EVERY DAY, Disp: 90 tablet, Rfl: 1 .  Coenzyme Q10-Vitamin E (QUNOL ULTRA COQ10 PO), Take by mouth., Disp: , Rfl:  .  Omega-3 Fatty Acids (FISH OIL) 1000 MG CAPS, Take by mouth., Disp: , Rfl:  .  Psyllium (METAMUCIL FIBER PO), Take by mouth., Disp: , Rfl:     Allergies  Allergen Reactions  . Aspirin Swelling    Men's preventive visit. Patient Health Questionnaire (PHQ-2) is    Office Visit from 10/25/2019 in [facility]  PHQ-2 Total Score  0    . Patient is on a healthy diet. Marital status: Married. Relevant history for alcohol use is:  Social History   Substance and Sexual Activity  Alcohol Use Yes  . Alcohol/week: 6.0 standard drinks  . Types: 6 Cans of beer per week   Comment: occ  . Relevant history for tobacco use is:  Social History   Tobacco Use  Smoking Status Never Smoker  Smokeless Tobacco Never Used  .  Review of Systems  Constitutional: Negative.   HENT: Negative.   Eyes: Negative.  Negative for blurred vision.  Respiratory: Negative.  Negative for shortness of breath.   Cardiovascular: Negative.  Negative for chest pain and palpitations.  Endocrine: Negative.   Genitourinary: Negative.   Musculoskeletal: Negative.   Skin: Negative.   Allergic/Immunologic: Negative.   Neurological: Negative.   Hematological: Negative.   Psychiatric/Behavioral: Negative.      Today's Vitals   10/25/19 1504  BP: 118/76  Pulse: 94  Temp: 98 F (36.7 C)  TempSrc: Oral  Weight: 220 lb 9.6 oz (100.1 kg)  Height: 5' 10.5" (1.791 m)   Body mass index is 31.21 kg/m.   Objective:  Physical  Exam Vitals and nursing note reviewed.  Constitutional:      Appearance: Normal appearance.  HENT:     Head: Normocephalic and atraumatic.     Right Ear: Tympanic membrane, ear canal and external ear normal.     Left Ear: Tympanic membrane, ear canal and external ear normal.     Nose: Nose normal.     Mouth/Throat:     Mouth: Mucous membranes are moist.     Pharynx: Oropharynx is clear.  Eyes:     Extraocular Movements: Extraocular movements intact.     Conjunctiva/sclera: Conjunctivae normal.     Pupils: Pupils are equal, round, and reactive to light.  Cardiovascular:     Rate and Rhythm: Normal rate  and regular rhythm.     Pulses: Normal pulses.     Heart sounds: Normal heart sounds.  Pulmonary:     Effort: Pulmonary effort is normal.     Breath sounds: Normal breath sounds.  Chest:     Breasts:        Right: Normal. No swelling, bleeding, inverted nipple, mass or nipple discharge.        Left: Normal. No swelling, bleeding, inverted nipple, mass or nipple discharge.  Abdominal:     General: Abdomen is flat. Bowel sounds are normal.     Palpations: Abdomen is soft.  Genitourinary:    Comments: Deferred, as per patient Musculoskeletal:        General: Normal range of motion.     Cervical back: Normal range of motion and neck supple.  Skin:    General: Skin is warm.  Neurological:     General: No focal deficit present.     Mental Status: He is alert.  Psychiatric:        Mood and Affect: Mood normal.        Behavior: Behavior normal.         Assessment And Plan:     1. Routine general medical examination at health care facility  A full exam was performed. DRE deferred, per patient's request. PATIENT HAS BEEN ADVISED TO GET 30-45 MINUTES REGULAR EXERCISE NO LESS THAN FOUR TO FIVE DAYS PER WEEK - BOTH WEIGHTBEARING EXERCISES AND AEROBIC ARE RECOMMENDED.  HE IS ADVISED TO FOLLOW A HEALTHY DIET WITH AT LEAST SIX FRUITS/VEGGIES PER DAY, DECREASE INTAKE OF RED MEAT, AND TO INCREASE FISH INTAKE TO TWO DAYS PER WEEK.  MEATS/FISH SHOULD NOT BE FRIED, BAKED OR BROILED IS PREFERABLE.  I SUGGEST WEARING SPF 50 SUNSCREEN ON EXPOSED PARTS AND ESPECIALLY WHEN IN THE DIRECT SUNLIGHT FOR AN EXTENDED PERIOD OF TIME.  PLEASE AVOID FAST FOOD RESTAURANTS AND INCREASE YOUR WATER INTAKE.   - CMP14+EGFR - CBC - Lipid panel - Hemoglobin A1c - PSA  2. Essential hypertension, benign  Chronic, well controlled. He is encouraged to avoid adding salt to his foods. EKG performed, no new changes noted. He will rto in six months. Importance of dietary, exercise and office visit compliance was discussed  with the patient.   - POCT Urinalysis Dipstick (81002) - POCT UA - Microalbumin - EKG 12-Lead  3. Need for vaccination  - Flu Vaccine QUAD 6+ mos PF IM (Fluarix Quad PF)  4. Special screening for malignant neoplasm of colon  I will refer him to GI for CRC screening.   - Ambulatory referral to Gastroenterology-[facility]      Maximino Greenland, MD    THE PATIENT IS ENCOURAGED TO PRACTICE SOCIAL DISTANCING DUE TO THE COVID-19 PANDEMIC.

## 2019-10-25 NOTE — Patient Instructions (Signed)

## 2019-10-26 LAB — CMP14+EGFR
ALT: 19 IU/L (ref 0–44)
AST: 24 IU/L (ref 0–40)
Albumin/Globulin Ratio: 1.4 (ref 1.2–2.2)
Albumin: 4.6 g/dL (ref 4.0–5.0)
Alkaline Phosphatase: 88 IU/L (ref 39–117)
BUN/Creatinine Ratio: 12 (ref 9–20)
BUN: 13 mg/dL (ref 6–24)
Bilirubin Total: 1 mg/dL (ref 0.0–1.2)
CO2: 25 mmol/L (ref 20–29)
Calcium: 10 mg/dL (ref 8.7–10.2)
Chloride: 99 mmol/L (ref 96–106)
Creatinine, Ser: 1.07 mg/dL (ref 0.76–1.27)
GFR calc Af Amer: 93 mL/min/{1.73_m2} (ref >59)
GFR calc non Af Amer: 81 mL/min/{1.73_m2} (ref >59)
Globulin, Total: 3.2 g/dL (ref 1.5–4.5)
Glucose: 106 mg/dL — ABNORMAL HIGH (ref 65–99)
Potassium: 4.5 mmol/L (ref 3.5–5.2)
Sodium: 136 mmol/L (ref 134–144)
Total Protein: 7.8 g/dL (ref 6.0–8.5)

## 2019-10-26 LAB — CBC
Hematocrit: 46.2 % (ref 37.5–51.0)
Hemoglobin: 15.6 g/dL (ref 13.0–17.7)
MCH: 28.9 pg (ref 26.6–33.0)
MCHC: 33.8 g/dL (ref 31.5–35.7)
MCV: 86 fL (ref 79–97)
Platelets: 351 10*3/uL (ref 150–450)
RBC: 5.4 x10E6/uL (ref 4.14–5.80)
RDW: 12.3 % (ref 11.6–15.4)
WBC: 10.6 10*3/uL (ref 3.4–10.8)

## 2019-10-26 LAB — LIPID PANEL
Chol/HDL Ratio: 3.5 ratio (ref 0.0–5.0)
Cholesterol, Total: 178 mg/dL (ref 100–199)
HDL: 51 mg/dL (ref >39)
LDL Chol Calc (NIH): 114 mg/dL — ABNORMAL HIGH (ref 0–99)
Triglycerides: 70 mg/dL (ref 0–149)
VLDL Cholesterol Cal: 13 mg/dL (ref 5–40)

## 2019-10-26 LAB — PSA: Prostate Specific Ag, Serum: 0.3 ng/mL (ref 0.0–4.0)

## 2019-10-26 LAB — HEMOGLOBIN A1C
Est. average glucose Bld gHb Est-mCnc: 126 mg/dL
Hgb A1c MFr Bld: 6 % — ABNORMAL HIGH (ref 4.8–5.6)

## 2019-12-10 LAB — HM COLONOSCOPY

## 2019-12-11 ENCOUNTER — Encounter: Payer: Self-pay | Admitting: Internal Medicine

## 2020-04-11 ENCOUNTER — Other Ambulatory Visit: Payer: Self-pay | Admitting: Internal Medicine

## 2020-04-29 ENCOUNTER — Ambulatory Visit: Payer: Managed Care, Other (non HMO) | Admitting: Internal Medicine

## 2020-05-03 ENCOUNTER — Other Ambulatory Visit: Payer: Self-pay | Admitting: Internal Medicine

## 2020-06-10 ENCOUNTER — Other Ambulatory Visit: Payer: Self-pay | Admitting: Internal Medicine

## 2020-06-22 ENCOUNTER — Other Ambulatory Visit: Payer: Self-pay | Admitting: Internal Medicine

## 2020-07-12 ENCOUNTER — Other Ambulatory Visit: Payer: Self-pay | Admitting: Internal Medicine

## 2020-07-15 ENCOUNTER — Encounter: Payer: Self-pay | Admitting: Internal Medicine

## 2020-07-15 ENCOUNTER — Other Ambulatory Visit: Payer: Self-pay

## 2020-07-15 ENCOUNTER — Ambulatory Visit (INDEPENDENT_AMBULATORY_CARE_PROVIDER_SITE_OTHER): Payer: Managed Care, Other (non HMO) | Admitting: Internal Medicine

## 2020-07-15 VITALS — BP 138/84 | HR 96 | Temp 97.6°F | Ht 70.5 in | Wt 218.4 lb

## 2020-07-15 DIAGNOSIS — R202 Paresthesia of skin: Secondary | ICD-10-CM | POA: Diagnosis not present

## 2020-07-15 DIAGNOSIS — I10 Essential (primary) hypertension: Secondary | ICD-10-CM

## 2020-07-15 DIAGNOSIS — R7309 Other abnormal glucose: Secondary | ICD-10-CM | POA: Diagnosis not present

## 2020-07-15 DIAGNOSIS — M25512 Pain in left shoulder: Secondary | ICD-10-CM

## 2020-07-15 DIAGNOSIS — Z1159 Encounter for screening for other viral diseases: Secondary | ICD-10-CM

## 2020-07-15 DIAGNOSIS — Z6830 Body mass index (BMI) 30.0-30.9, adult: Secondary | ICD-10-CM

## 2020-07-15 DIAGNOSIS — G8929 Other chronic pain: Secondary | ICD-10-CM

## 2020-07-15 MED ORDER — AMLODIPINE BESYLATE-VALSARTAN 5-160 MG PO TABS: 1.0000 | ORAL_TABLET | Freq: Every day | ORAL | 1 refills | Status: DC

## 2020-07-15 NOTE — Progress Notes (Signed)
I,Katawbba Wiggins,acting as a Education administrator for Maximino Greenland, MD.,have documented all relevant documentation on the behalf of Maximino Greenland, MD,as directed by  Maximino Greenland, MD while in the presence of Maximino Greenland, MD.  This visit occurred during the SARS-CoV-2 public health emergency.  Safety protocols were in place, including screening questions prior to the visit, additional usage of staff PPE, and extensive cleaning of exam room while observing appropriate contact time as indicated for disinfecting solutions.  Subjective:     Patient ID: Craig Nguyen , male    DOB: Dec 28, 1970 , 49 y.o.   MRN: [id number]   Chief Complaint  Patient presents with  . Hypertension    HPI  The patient is here today for a blood pressure follow-up.  He reports compliance with meds.   Hypertension This is a chronic problem. The current episode started more than 1 year ago. The problem has been gradually improving since onset. The problem is controlled. Pertinent negatives include no blurred vision, chest pain, palpitations or shortness of breath. Past treatments include angiotensin blockers and calcium channel blockers. The current treatment provides moderate improvement. Compliance problems include exercise.      Past Medical History:  Diagnosis Date  . Diabetes mellitus without complication ([place])   . Hypertension   . Thyroid nodule    right side & benign     Family History  Problem Relation Age of Onset  . Diabetes Mother   . Hypertension Mother   . Kidney failure Mother   . Diabetes Father   . Hypertension Father   . Heart disease Father   . Diabetes Other   . Hypertension Other   . Prostate cancer Other   . Sarcoidosis Other        pulmonary, sister  . Diabetes Brother   . Hypertension Brother      Current Outpatient Medications:  .  APPLE CIDER VINEGAR PO, Take by mouth., Disp: , Rfl:  .  CINNAMON PO, Take by mouth., Disp: , Rfl:  .  Coenzyme Q10-Vitamin E (QUNOL ULTRA  COQ10 PO), Take by mouth., Disp: , Rfl:  .  Omega-3 Fatty Acids (FISH OIL) 1000 MG CAPS, Take by mouth., Disp: , Rfl:  .  Psyllium (METAMUCIL FIBER PO), Take by mouth., Disp: , Rfl:  .  amLODipine-valsartan (EXFORGE) 5-160 MG tablet, TAKE 1 TABLET BY MOUTH EVERY DAY, Disp: 30 tablet, Rfl: 0 .  amLODipine-valsartan (EXFORGE) 5-160 MG tablet, Take 1 tablet by mouth daily., Disp: 90 tablet, Rfl: 1   Allergies  Allergen Reactions  . Aspirin Swelling     Review of Systems  Constitutional: Negative.   Eyes: Negative for blurred vision.  Respiratory: Negative.  Negative for shortness of breath.   Cardiovascular: Negative.  Negative for chest pain and palpitations.  Gastrointestinal: Negative.   Musculoskeletal:       He c/o pain and numbness in left shoulder on and off for 6 months.  He is not sure what could have triggered his sx. Denies fall/trauma. He is employed as truckdriver and drives long distances. Denies neck pain. Denies LUE weakness. Admits to LUE paresthesias.   Neurological: Positive for numbness.  Psychiatric/Behavioral: Negative.   All other systems reviewed and are negative.    Today's Vitals   07/15/20 1204  BP: 138/84  Pulse: 96  Temp: 97.6 F (36.4 C)  TempSrc: Oral  Weight: 218 lb 6.4 oz (99.1 kg)  Height: 5' 10.5" (1.791 m)   Body mass index is  30.89 kg/m.  Wt Readings from Last 3 Encounters:  07/15/20 218 lb 6.4 oz (99.1 kg)  10/25/19 220 lb 9.6 oz (100.1 kg)  08/21/18 223 lb 9.6 oz (101.4 kg)   Objective:  Physical Exam Vitals and nursing note reviewed.  Constitutional:      Appearance: Normal appearance.  HENT:     Head: Normocephalic and atraumatic.  Cardiovascular:     Rate and Rhythm: Normal rate and regular rhythm.     Heart sounds: Normal heart sounds.  Pulmonary:     Breath sounds: Normal breath sounds.  Musculoskeletal:        General: Tenderness present.     Comments: Left shoulder tenderness to palpation. Full ROM. Trapezius MM tender  to palpation  Skin:    General: Skin is warm.  Neurological:     General: No focal deficit present.     Mental Status: He is alert and oriented to person, place, and time.         Assessment And Plan:     1. Essential hypertension, benign Comments: Chronic, fair control. Advised goal BP is less than 130/80. Encouraged to incorporate more exercise into his daily routine.I will check renal function. He will rto in Jan 2022 for his next physical exam.   - BMP8+EGFR  2. Chronic left shoulder pain Comments: Non-traumatic. I will refer him to Ortho as requested.  - Ambulatory referral to Orthopedic Surgery  3. Paresthesia Comments: I will check vitamin B12 level. However, I think his sx are likely due to cervical spine disease.   4. Abnormal glucose Comments: Last a1c results reviewed in full detail. I will recheck a1c today. Encouraged to avoid sugary beverages, including diet.  - Hemoglobin A1c  5. BMI 30.0-30.9,adult Comments: Encouraged to strive for BMI less than 27 to decrease cardiac risk.   6. Need for hepatitis C screening test Comments: I will check HCV antibody.  - Hepatitis C antibody  Patient was given opportunity to ask questions. Patient verbalized understanding of the plan and was able to repeat key elements of the plan. All questions were answered to their satisfaction.  Maximino Greenland, MD   I, Maximino Greenland, MD, have reviewed all documentation for this visit. The documentation on 07/15/20 for the exam, diagnosis, procedures, and orders are all accurate and complete.  THE PATIENT IS ENCOURAGED TO PRACTICE SOCIAL DISTANCING DUE TO THE COVID-19 PANDEMIC.

## 2020-07-15 NOTE — Patient Instructions (Signed)

## 2020-07-16 LAB — HEMOGLOBIN A1C
Est. average glucose Bld gHb Est-mCnc: 137 mg/dL
Hgb A1c MFr Bld: 6.4 % — ABNORMAL HIGH (ref 4.8–5.6)

## 2020-07-16 LAB — BMP8+EGFR
BUN/Creatinine Ratio: 11 (ref 9–20)
BUN: 11 mg/dL (ref 6–24)
CO2: 24 mmol/L (ref 20–29)
Calcium: 9.5 mg/dL (ref 8.7–10.2)
Chloride: 100 mmol/L (ref 96–106)
Creatinine, Ser: 1 mg/dL (ref 0.76–1.27)
GFR calc Af Amer: 100 mL/min/{1.73_m2} (ref >59)
GFR calc non Af Amer: 87 mL/min/{1.73_m2} (ref >59)
Glucose: 103 mg/dL — ABNORMAL HIGH (ref 65–99)
Potassium: 4.4 mmol/L (ref 3.5–5.2)
Sodium: 137 mmol/L (ref 134–144)

## 2020-07-16 LAB — HEPATITIS C ANTIBODY: Hep C Virus Ab: 0.1 s/co ratio (ref 0.0–0.9)

## 2020-11-10 ENCOUNTER — Encounter: Payer: Self-pay | Admitting: Internal Medicine

## 2020-11-10 ENCOUNTER — Ambulatory Visit (INDEPENDENT_AMBULATORY_CARE_PROVIDER_SITE_OTHER): Payer: Managed Care, Other (non HMO) | Admitting: Internal Medicine

## 2020-11-10 ENCOUNTER — Other Ambulatory Visit: Payer: Self-pay

## 2020-11-10 VITALS — BP 130/78 | HR 76 | Temp 97.9°F | Ht 71.2 in | Wt 208.8 lb

## 2020-11-10 DIAGNOSIS — I10 Essential (primary) hypertension: Secondary | ICD-10-CM

## 2020-11-10 DIAGNOSIS — E663 Overweight: Secondary | ICD-10-CM

## 2020-11-10 DIAGNOSIS — Z6828 Body mass index (BMI) 28.0-28.9, adult: Secondary | ICD-10-CM

## 2020-11-10 DIAGNOSIS — Z23 Encounter for immunization: Secondary | ICD-10-CM

## 2020-11-10 DIAGNOSIS — Z0000 Encounter for general adult medical examination without abnormal findings: Secondary | ICD-10-CM | POA: Diagnosis not present

## 2020-11-10 DIAGNOSIS — R81 Glycosuria: Secondary | ICD-10-CM

## 2020-11-10 LAB — POCT URINALYSIS DIPSTICK
Bilirubin, UA: NEGATIVE
Blood, UA: NEGATIVE
Glucose, UA: POSITIVE — AB
Ketones, UA: 40
Leukocytes, UA: NEGATIVE
Nitrite, UA: NEGATIVE
Protein, UA: NEGATIVE
Spec Grav, UA: 1.015 (ref 1.010–1.025)
Urobilinogen, UA: 0.2 E.U./dL
pH, UA: 5.5 (ref 5.0–8.0)

## 2020-11-10 LAB — POCT UA - MICROALBUMIN
Creatinine, POC: 50 mg/dL
Microalbumin Ur, POC: 30 mg/L

## 2020-11-10 MED ORDER — AMLODIPINE BESYLATE-VALSARTAN 5-160 MG PO TABS: 1.0000 | ORAL_TABLET | Freq: Every day | ORAL | 1 refills | Status: DC

## 2020-11-10 MED ORDER — SHINGRIX 50 MCG/0.5ML IM SUSR: 0.5000 mL | Freq: Once | INTRAMUSCULAR | 0 refills | Status: AC

## 2020-11-10 NOTE — Patient Instructions (Signed)

## 2020-11-10 NOTE — Progress Notes (Signed)
I,Tianna Badgett,acting as a Education administrator for Maximino Greenland, MD.,have documented all relevant documentation on the behalf of Maximino Greenland, MD,as directed by  Maximino Greenland, MD while in the presence of Maximino Greenland, MD.  This visit occurred during the SARS-CoV-2 public health emergency.  Safety protocols were in place, including screening questions prior to the visit, additional usage of staff PPE, and extensive cleaning of exam room while observing appropriate contact time as indicated for disinfecting solutions.  Subjective:     Patient ID: Craig Nguyen , male    DOB: 1971/03/08 , 50 y.o.   MRN: [id number]   Chief Complaint  Patient presents with  . Annual Exam  . Hypertension    HPI  He is here today for a full physical examination. He has no specific concerns or complaints at this time. He reports compliance with meds. He does admit that he has been eating liberally and not exercising as he should.   Hypertension This is a chronic problem. The current episode started more than 1 year ago. The problem has been gradually improving since onset. The problem is controlled. Pertinent negatives include no blurred vision, chest pain, palpitations or shortness of breath. Past treatments include angiotensin blockers and calcium channel blockers. The current treatment provides moderate improvement. Compliance problems include exercise.      Past Medical History:  Diagnosis Date  . Diabetes mellitus without complication ([place])   . Hypertension   . Thyroid nodule    right side & benign     Family History  Problem Relation Age of Onset  . Diabetes Mother   . Hypertension Mother   . Kidney failure Mother   . Diabetes Father   . Hypertension Father   . Heart disease Father   . Diabetes Other   . Hypertension Other   . Prostate cancer Other   . Sarcoidosis Other        pulmonary, sister  . Diabetes Brother   . Hypertension Brother      Current Outpatient Medications:  .   APPLE CIDER VINEGAR PO, Take by mouth., Disp: , Rfl:  .  CINNAMON PO, Take by mouth., Disp: , Rfl:  .  Omega-3 Fatty Acids (FISH OIL) 1000 MG CAPS, Take by mouth., Disp: , Rfl:  .  Zoster Vaccine Adjuvanted [facility]) injection, Inject 0.5 mLs into the muscle once for 1 dose., Disp: 0.5 mL, Rfl: 0 .  amLODipine-valsartan (EXFORGE) 5-160 MG tablet, Take 1 tablet by mouth daily., Disp: 90 tablet, Rfl: 1   Allergies  Allergen Reactions  . Aspirin Swelling     Men's preventive visit. Patient Health Questionnaire (PHQ-2) is  [place] Office Visit from 07/15/2020 in [facility]  PHQ-2 Total Score 0    . Patient is on a low sodium diet. Marital status: Married. Relevant history for alcohol use is:  Social History   Substance and Sexual Activity  Alcohol Use Yes  . Alcohol/week: 6.0 standard drinks  . Types: 6 Cans of beer per week   Comment: occ  . Relevant history for tobacco use is:  Social History   Tobacco Use  Smoking Status Never Smoker  Smokeless Tobacco Never Used  .   Review of Systems  Constitutional: Negative.   HENT: Negative.   Eyes: Negative.  Negative for blurred vision.  Respiratory: Negative.  Negative for shortness of breath.   Cardiovascular: Negative.  Negative for chest pain and palpitations.  Gastrointestinal: Negative.   Endocrine: Negative.  Genitourinary: Negative.   Musculoskeletal: Negative.   Skin: Negative.   Allergic/Immunologic: Negative.   Neurological: Negative.   Hematological: Negative.   Psychiatric/Behavioral: Negative.      Today's Vitals   11/10/20 0922  BP: 130/78  Pulse: 76  Temp: 97.9 F (36.6 C)  TempSrc: Oral  Weight: 208 lb 12.8 oz (94.7 kg)  Height: 5' 11.2" (1.808 m)   Body mass index is 28.96 kg/m.  Wt Readings from Last 3 Encounters:  11/10/20 208 lb 12.8 oz (94.7 kg)  07/15/20 218 lb 6.4 oz (99.1 kg)  10/25/19 220 lb 9.6 oz (100.1 kg)     Objective:  Physical Exam Vitals and  nursing note reviewed.  Constitutional:      Appearance: Normal appearance.  HENT:     Head: Normocephalic and atraumatic.     Right Ear: Tympanic membrane, ear canal and external ear normal.     Left Ear: Tympanic membrane, ear canal and external ear normal.     Nose:     Comments: Deferred, masked    Mouth/Throat:     Comments: Deferred, masked Eyes:     Extraocular Movements: Extraocular movements intact.     Conjunctiva/sclera: Conjunctivae normal.     Pupils: Pupils are equal, round, and reactive to light.  Cardiovascular:     Rate and Rhythm: Normal rate and regular rhythm.     Pulses:          Dorsalis pedis pulses are 3+ on the right side and 3+ on the left side.     Heart sounds: Normal heart sounds.  Pulmonary:     Effort: Pulmonary effort is normal.     Breath sounds: Normal breath sounds.  Chest:  Breasts:     Right: Normal. No swelling, bleeding, inverted nipple, mass or nipple discharge.     Left: Normal. No swelling, bleeding, inverted nipple, mass or nipple discharge.    Abdominal:     General: Abdomen is flat. Bowel sounds are normal.     Palpations: Abdomen is soft.  Genitourinary:    Comments: Deferred, as per patient Musculoskeletal:        General: Normal range of motion.     Cervical back: Normal range of motion and neck supple.  Feet:     Right foot:     Protective Sensation: 5 sites tested. 5 sites sensed.     Skin integrity: Dry skin present.     Toenail Condition: Right toenails are normal.     Left foot:     Protective Sensation: 5 sites tested. 5 sites sensed.     Skin integrity: Dry skin present.     Toenail Condition: Left toenails are normal.  Skin:    General: Skin is warm.  Neurological:     General: No focal deficit present.     Mental Status: He is alert.  Psychiatric:        Mood and Affect: Mood normal.        Behavior: Behavior normal.         Assessment And Plan:    1. Routine general medical examination at health care  facility Comments: A full exam was performed. DRE deferred, per patient request. I will check labs as listed below. PATIENT IS ADVISED TO GET 30-45 MINUTES REGULAR EXERCISE NO LESS THAN FOUR TO FIVE DAYS PER WEEK - BOTH WEIGHTBEARING EXERCISES AND AEROBIC ARE RECOMMENDED.  PATIENT IS ADVISED TO FOLLOW A HEALTHY DIET WITH AT LEAST SIX FRUITS/VEGGIES PER DAY, DECREASE  INTAKE OF RED MEAT, AND TO INCREASE FISH INTAKE TO TWO DAYS PER WEEK.  MEATS/FISH SHOULD NOT BE FRIED, BAKED OR BROILED IS PREFERABLE.  I SUGGEST WEARING SPF 50 SUNSCREEN ON EXPOSED PARTS AND ESPECIALLY WHEN IN THE DIRECT SUNLIGHT FOR AN EXTENDED PERIOD OF TIME.  PLEASE AVOID FAST FOOD RESTAURANTS AND INCREASE YOUR WATER INTAKE. - CBC - Hemoglobin A1c - CMP14+EGFR - Lipid panel - PSA  2. Essential hypertension, benign Comments: Chronic, controlled. He will continue with current meds, EKG performed, NSR w/ LVH by voltage criteria. He is encouraged to resume his regular exercise regimen.  He will rto in six months for re-evaluation.  - EKG 12-Lead - POCT Urinalysis Dipstick (81002) - POCT UA - Microalbumin  3. Glucosuria Comments: I will check an a1c today. He is encouraged to avoid sugary beverages, including diet drinks.   4. Overweight with body mass index (BMI) of 28 to 28.9 in adult Comments: He is encouraged to aim for at least 150 minutes of exercise per week.   5. Immunization due Comments: He was given flu vaccine to update his immunization history. I will also send him rx Shingrix to his local pharmacy. Advised to wait 2 weeks. - Flu Vaccine QUAD 6+ mos PF IM (Fluarix Quad PF)  Patient was given opportunity to ask questions. Patient verbalized understanding of the plan and was able to repeat key elements of the plan. All questions were answered to their satisfaction.  Maximino Greenland, MD   I, Maximino Greenland, MD, have reviewed all documentation for this visit. The documentation on 11/10/20 for the exam, diagnosis,  procedures, and orders are all accurate and complete.  THE PATIENT IS ENCOURAGED TO PRACTICE SOCIAL DISTANCING DUE TO THE COVID-19 PANDEMIC.

## 2020-11-11 ENCOUNTER — Encounter: Payer: Self-pay | Admitting: Internal Medicine

## 2020-11-11 LAB — LIPID PANEL
Chol/HDL Ratio: 4.3 ratio (ref 0.0–5.0)
Cholesterol, Total: 199 mg/dL (ref 100–199)
HDL: 46 mg/dL (ref >39)
LDL Chol Calc (NIH): 113 mg/dL — ABNORMAL HIGH (ref 0–99)
Triglycerides: 231 mg/dL — ABNORMAL HIGH (ref 0–149)
VLDL Cholesterol Cal: 40 mg/dL (ref 5–40)

## 2020-11-11 LAB — CMP14+EGFR
ALT: 12 IU/L (ref 0–44)
AST: 17 IU/L (ref 0–40)
Albumin/Globulin Ratio: 1.6 (ref 1.2–2.2)
Albumin: 4.5 g/dL (ref 3.8–4.9)
Alkaline Phosphatase: 103 IU/L (ref 44–121)
BUN/Creatinine Ratio: 9 (ref 9–20)
BUN: 9 mg/dL (ref 6–24)
Bilirubin Total: 0.9 mg/dL (ref 0.0–1.2)
CO2: 22 mmol/L (ref 20–29)
Calcium: 9.5 mg/dL (ref 8.7–10.2)
Chloride: 94 mmol/L — ABNORMAL LOW (ref 96–106)
Creatinine, Ser: 1.01 mg/dL (ref 0.76–1.27)
GFR calc Af Amer: 99 mL/min/{1.73_m2} (ref >59)
GFR calc non Af Amer: 86 mL/min/{1.73_m2} (ref >59)
Globulin, Total: 2.8 g/dL (ref 1.5–4.5)
Glucose: 296 mg/dL — ABNORMAL HIGH (ref 65–99)
Potassium: 4.2 mmol/L (ref 3.5–5.2)
Sodium: 133 mmol/L — ABNORMAL LOW (ref 134–144)
Total Protein: 7.3 g/dL (ref 6.0–8.5)

## 2020-11-11 LAB — CBC
Hematocrit: 43.6 % (ref 37.5–51.0)
Hemoglobin: 15.3 g/dL (ref 13.0–17.7)
MCH: 30 pg (ref 26.6–33.0)
MCHC: 35.1 g/dL (ref 31.5–35.7)
MCV: 86 fL (ref 79–97)
Platelets: 302 10*3/uL (ref 150–450)
RBC: 5.1 x10E6/uL (ref 4.14–5.80)
RDW: 11.8 % (ref 11.6–15.4)
WBC: 7 10*3/uL (ref 3.4–10.8)

## 2020-11-11 LAB — PSA: Prostate Specific Ag, Serum: 0.2 ng/mL (ref 0.0–4.0)

## 2020-11-11 LAB — HEMOGLOBIN A1C
Est. average glucose Bld gHb Est-mCnc: 301 mg/dL
Hgb A1c MFr Bld: 12.1 % — ABNORMAL HIGH (ref 4.8–5.6)

## 2020-11-12 ENCOUNTER — Encounter: Payer: Self-pay | Admitting: Internal Medicine

## 2020-11-13 ENCOUNTER — Other Ambulatory Visit: Payer: Self-pay | Admitting: Internal Medicine

## 2020-11-13 MED ORDER — METFORMIN HCL 500 MG PO TABS: ORAL_TABLET | ORAL | 11 refills | Status: DC

## 2020-11-14 ENCOUNTER — Ambulatory Visit (INDEPENDENT_AMBULATORY_CARE_PROVIDER_SITE_OTHER): Payer: Managed Care, Other (non HMO) | Admitting: Nurse Practitioner

## 2020-11-14 ENCOUNTER — Encounter: Payer: Self-pay | Admitting: Nurse Practitioner

## 2020-11-14 ENCOUNTER — Other Ambulatory Visit: Payer: Self-pay

## 2020-11-14 VITALS — BP 130/78 | HR 90 | Temp 98.1°F | Ht 71.2 in | Wt 208.8 lb

## 2020-11-14 DIAGNOSIS — I10 Essential (primary) hypertension: Secondary | ICD-10-CM

## 2020-11-14 DIAGNOSIS — E663 Overweight: Secondary | ICD-10-CM | POA: Diagnosis not present

## 2020-11-14 DIAGNOSIS — Z6828 Body mass index (BMI) 28.0-28.9, adult: Secondary | ICD-10-CM

## 2020-11-14 DIAGNOSIS — E1165 Type 2 diabetes mellitus with hyperglycemia: Secondary | ICD-10-CM | POA: Diagnosis not present

## 2020-11-14 NOTE — Progress Notes (Signed)
I,Tianna Badgett,acting as a Neurosurgeon for [employer], NP.,have documented all relevant documentation on the behalf of [employer], NP,as directed by  Charlesetta Ivory, NP while in the presence of Charlesetta Ivory, NP.  This visit occurred during the SARS-CoV-2 public health emergency.  Safety protocols were in place, including screening questions prior to the visit, additional usage of staff PPE, and extensive cleaning of exam room while observing appropriate contact time as indicated for disinfecting solutions.  Subjective:     Patient ID: Craig Nguyen , male    DOB: 11/10/1970 , 50 y.o.   MRN: [id number]   Chief Complaint  Patient presents with  . Diabetes    HPI  Patient is here for dm check. His last  A1C results were above 12. 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 He would like to start a pill form of medication which he has already been prescribed. He admits to "partying too much" and not "eating right." He would like his STD forms filled out since he is unable to work with an A1C greater than 10. He wants to try the metformin and does not want to add anything else at this time. He stated he has been non-compliant with his diet and exercise for a long time and is willing to get it back to track.  Diet: Salads, no sugar, no bread, brown rice, baked foods, for breakfast he does scrambled eggs no yolks, grits. Salad. Drinks tea unsweet with lemon. Water with lemon. He tries to stay away from carbs. Lamb meet.   Exercising: 4 miles walking. Low intensity weights    Diabetes He has type 2 diabetes mellitus. His disease course has been worsening. Pertinent negatives for hypoglycemia include no confusion, hunger or sweats. Pertinent negatives for diabetes include no chest pain, no polydipsia, no polyphagia and no polyuria. There are no hypoglycemic complications. Risk factors for coronary artery disease include hypertension. When asked  about current treatments, none were reported. He is following a generally unhealthy diet. When asked about meal planning, he reported none. He has not had a previous visit with a dietitian. (200s at home )     Past Medical History:  Diagnosis Date  . Diabetes mellitus without complication (HCC)   . Hypertension   . Thyroid nodule    right side & benign     Family History  Problem Relation Age of Onset  . Diabetes Mother   . Hypertension Mother   . Kidney failure Mother   . Diabetes Father   . Hypertension Father   . Heart disease Father   . Diabetes Other   . Hypertension Other   . Prostate cancer Other   . Sarcoidosis Other        pulmonary, sister  . Diabetes Brother   . Hypertension Brother      Current Outpatient Medications:  .  metFORMIN (GLUCOPHAGE) 500 MG tablet, One tab po daily x 7 days, then twice daily, Disp: 60 tablet, Rfl: 11 .  amLODipine-valsartan (EXFORGE) 5-160 MG tablet, Take 1 tablet by mouth daily., Disp: 90 tablet, Rfl: 1 .  APPLE CIDER VINEGAR PO, Take by mouth., Disp: , Rfl:  .  CINNAMON PO, Take by mouth., Disp: , Rfl:  .  Omega-3 Fatty Acids (FISH OIL) 1000 MG CAPS, Take by mouth., Disp: , Rfl:    Allergies  Allergen Reactions  . Aspirin Swelling     Review of Systems  Constitutional: Negative.  Negative for chills, diaphoresis and fever.  Eyes: Negative for visual disturbance.  Respiratory: Negative.  Negative for choking, shortness of breath and wheezing.   Cardiovascular: Negative.  Negative for chest pain and palpitations.  Gastrointestinal: Negative.  Negative for abdominal pain and nausea.  Endocrine: Negative for polydipsia, polyphagia and polyuria.  Neurological: Negative.   Psychiatric/Behavioral: Negative for confusion.     Today's Vitals   11/14/20 0954  BP: 130/78  Pulse: 90  Temp: 98.1 F (36.7 C)  TempSrc: Oral  Weight: 208 lb 12.8 oz (94.7 kg)  Height: 5' 11.2" (1.808 m)   Body mass index is 28.96 kg/m.  Wt  Readings from Last 3 Encounters:  11/14/20 208 lb 12.8 oz (94.7 kg)  11/10/20 208 lb 12.8 oz (94.7 kg)  07/15/20 218 lb 6.4 oz (99.1 kg)    Objective:  Physical Exam Constitutional:      Appearance: Normal appearance. He is obese. He is not ill-appearing.  HENT:     Head: Normocephalic and atraumatic.  Cardiovascular:     Rate and Rhythm: Normal rate and regular rhythm.     Pulses: Normal pulses.     Heart sounds: Normal heart sounds.  Skin:    Capillary Refill: Capillary refill takes less than 2 seconds.  Neurological:     Mental Status: He is alert.  Psychiatric:        Mood and Affect: Mood normal.        Behavior: Behavior normal.        Thought Content: Thought content normal.        Judgment: Judgment normal.         Assessment And Plan:     1. Uncontrolled type 2 diabetes mellitus with hyperglycemia (HCC) -Patient will start metformin and work on lifestyle modification at this time -Patient refused to start any other medication regimen for his diabetes at this time  -Patient educated on complication of diabetes and HTN including retinopathy, neuropathy and other CVD risks.  -Patient educated on lifestyle modification which includes diet and exercise. Avoid sugary foods and drinks. Avoid carbs. Increase intake of fish and lean meats. Avoid red meats. Exercise 4-5 a week for atleast 45 min.  -Will recheck A1c in 3 months   2. Essential hypertension -Chronic, stable  -Currently taking amlodipine-valsartan 5-160 mg daily.  -Educated on a low sodium and avoid processed food diet.   3. Overweight with body mass index (BMI) of 28 to 28.9 in adult He is encouraged to initially strive for BMI less than 26 to decrease cardiac risk. He is advised to exercise no less than 150 minutes per week.    Staying healthy and adopting a healthy lifestyle for your overall health is important. You should eat 7 or more servings of fruits and vegetables per day. You should drink plenty of  water to keep yourself hydrated and your kidneys healthy. This includes about 65-80+ fluid ounces of water. Limit your intake of animal fats especially for elevated cholesterol. Avoid highly processed food and limit your salt intake if you have hypertension. Avoid foods high in saturated/Trans fats. Along with a healthy diet it is also very important to maintain time for yourself to maintain a healthy mental health with low stress levels. You should get atleast 150 min of moderate intensity exercise weekly for a healthy heart. Along with eating right and exercising, aim for at least 7-9 hours of sleep daily.  Eat more whole grains which includes barley, wheat berries, oats, brown rice  and whole wheat pasta. Use healthy plant oils which include olive, soy, corn, sunflower and peanut. Limit your caffeine and sugary drinks. Limit your intake of fast foods. Limit milk and dairy products to one or two daily servings.   Patient was given opportunity to ask questions. Patient verbalized understanding of the plan and was able to repeat key elements of the plan. All questions were answered to their satisfaction.  Bary Castilla, NP   I, Bary Castilla, NP, have reviewed all documentation for this visit. The documentation on 11/14/20 for the exam, diagnosis, procedures, and orders are all accurate and complete.  THE PATIENT IS ENCOURAGED TO PRACTICE SOCIAL DISTANCING DUE TO THE COVID-19 PANDEMIC.

## 2020-11-18 ENCOUNTER — Other Ambulatory Visit: Payer: Self-pay

## 2020-11-18 DIAGNOSIS — E1165 Type 2 diabetes mellitus with hyperglycemia: Secondary | ICD-10-CM

## 2020-12-11 ENCOUNTER — Encounter: Payer: Self-pay | Admitting: Endocrinology

## 2021-01-07 ENCOUNTER — Encounter: Payer: Self-pay | Admitting: Nurse Practitioner

## 2021-01-07 ENCOUNTER — Ambulatory Visit (INDEPENDENT_AMBULATORY_CARE_PROVIDER_SITE_OTHER): Payer: Managed Care, Other (non HMO) | Admitting: Nurse Practitioner

## 2021-01-07 ENCOUNTER — Other Ambulatory Visit: Payer: Self-pay

## 2021-01-07 VITALS — BP 130/78 | HR 81 | Temp 98.3°F | Ht 71.0 in | Wt 210.0 lb

## 2021-01-07 DIAGNOSIS — E1165 Type 2 diabetes mellitus with hyperglycemia: Secondary | ICD-10-CM

## 2021-01-07 LAB — POCT URINALYSIS DIPSTICK
Bilirubin, UA: NEGATIVE
Blood, UA: NEGATIVE
Glucose, UA: NEGATIVE
Ketones, UA: NEGATIVE
Leukocytes, UA: NEGATIVE
Nitrite, UA: NEGATIVE
Protein, UA: NEGATIVE
Spec Grav, UA: 1.015 (ref 1.010–1.025)
Urobilinogen, UA: 0.2 E.U./dL
pH, UA: 7 (ref 5.0–8.0)

## 2021-01-07 NOTE — Patient Instructions (Signed)
Diabetes Mellitus and Exercise Exercising regularly is important for overall health, especially for people who have diabetes mellitus. Exercising is not only about losing weight. It has many other health benefits, such as increasing muscle strength and bone density and reducing body fat and stress. This leads to improved fitness, flexibility, and endurance, all of which result in better overall health. What are the benefits of exercise if I have diabetes? Exercise has many benefits for people with diabetes. They include:  Helping to lower and control blood sugar (glucose).  Helping the body to respond better to the hormone insulin by improving insulin sensitivity.  Reducing how much insulin the body needs.  Lowering the risk for heart disease by: ? Lowering "bad" cholesterol and triglyceride levels. ? Increasing "good" cholesterol levels. ? Lowering blood pressure. ? Lowering blood glucose levels. What is my activity plan? Your health care provider or certified diabetes educator can help you make a plan for the type and frequency of exercise that works for you. This is called your activity plan. Be sure to:  Get at least 150 minutes of medium-intensity or high-intensity exercise each week. Exercises may include brisk walking, biking, or water aerobics.  Do stretching and strengthening exercises, such as yoga or weight lifting, at least 2 times a week.  Spread out your activity over at least 3 days of the week.  Get some form of physical activity each day. ? Do not go more than 2 days in a row without some kind of physical activity. ? Avoid being inactive for more than 90 minutes at a time. Take frequent breaks to walk or stretch.  Choose exercises or activities that you enjoy. Set realistic goals.  Start slowly and gradually increase your exercise intensity over time.   How do I manage my diabetes during exercise? Monitor your blood glucose  Check your blood glucose before and  after exercising. If your blood glucose is: ? 240 mg/dL (13.3 mmol/L) or higher before you exercise, check your urine for ketones. These are chemicals created by the liver. If you have ketones in your urine, do not exercise until your blood glucose returns to normal. ? 100 mg/dL (5.6 mmol/L) or lower, eat a snack containing 15-20 grams of carbohydrate. Check your blood glucose 15 minutes after the snack to make sure that your glucose level is above 100 mg/dL (5.6 mmol/L) before you start your exercise.  Know the symptoms of low blood glucose (hypoglycemia) and how to treat it. Your risk for hypoglycemia increases during and after exercise. Follow these tips and your health care provider's instructions  Keep a carbohydrate snack that is fast-acting for use before, during, and after exercise to help prevent or treat hypoglycemia.  Avoid injecting insulin into areas of the body that are going to be exercised. For example, avoid injecting insulin into: ? Your arms, when you are about to play tennis. ? Your legs, when you are about to go jogging.  Keep records of your exercise habits. Doing this can help you and your health care provider adjust your diabetes management plan as needed. Write down: ? Food that you eat before and after you exercise. ? Blood glucose levels before and after you exercise. ? The type and amount of exercise you have done.  Work with your health care provider when you start a new exercise or activity. He or she may need to: ? Make sure that the activity is safe for you. ? Adjust your insulin, other medicines, and food that   you eat.  Drink plenty of water while you exercise. This prevents loss of water (dehydration) and problems caused by a lot of heat in the body (heat stroke).   Where to find more information  American Diabetes Association: www.diabetes.org Summary  Exercising regularly is important for overall health, especially for people who have diabetes  mellitus.  Exercising has many health benefits. It increases muscle strength and bone density and reduces body fat and stress. It also lowers and controls blood glucose.  Your health care provider or certified diabetes educator can help you make an activity plan for the type and frequency of exercise that works for you.  Work with your health care provider to make sure any new activity is safe for you. Also work with your health care provider to adjust your insulin, other medicines, and the food you eat. This information is not intended to replace advice given to you by your health care provider. Make sure you discuss any questions you have with your health care provider. Document Revised: 07/23/2019 Document Reviewed: 07/23/2019 Elsevier Patient Education  2021 Elsevier Inc.  

## 2021-01-07 NOTE — Progress Notes (Signed)
I,Tianna Badgett,acting as a Neurosurgeon for [employer], NP.,have documented all relevant documentation on the behalf of [employer], NP,as directed by  Charlesetta Ivory, NP while in the presence of Charlesetta Ivory, NP.  This visit occurred during the SARS-CoV-2 public health emergency.  Safety protocols were in place, including screening questions prior to the visit, additional usage of staff PPE, and extensive cleaning of exam room while observing appropriate contact time as indicated for disinfecting solutions.  Subjective:     Patient ID: Craig Nguyen , male    DOB: Mar 01, 1971 , 50 y.o.   MRN: [id number]   Chief Complaint  Patient presents with  . Diabetes    HPI  Patient is here for dm check. His last  A1C results were above 12. He has been taking metformin regularly. He is currently exercising and dieting to help control his diabetes. He is started keeping a log of his blood sugar readings and his average reading in the morning is around 120.   Diet: he is eating healthier.  Alcohol: Does not drink. He drinks a lot of water. No dairy coffee. He eats protein bars.  Exercise: He is also walking.   Wt Readings from Last 3 Encounters: 01/07/21 : 210 lb (95.3 kg) 11/14/20 : 208 lb 12.8 oz (94.7 kg) 11/10/20 : 208 lb 12.8 oz (94.7 kg)    Diabetes He has type 2 diabetes mellitus. His disease course has been worsening. Pertinent negatives for hypoglycemia include no confusion, hunger or sweats. Pertinent negatives for diabetes include no chest pain, no polydipsia, no polyphagia and no polyuria. There are no hypoglycemic complications. Risk factors for coronary artery disease include hypertension. When asked about current treatments, none were reported. He is following a generally unhealthy diet. When asked about meal planning, he reported none. He has not had a previous visit with a dietitian. His breakfast blood glucose range is generally 110-130 mg/dl. (200s at home )      Past Medical History:  Diagnosis Date  . Diabetes mellitus without complication (HCC)   . Hypertension   . Thyroid nodule    right side & benign     Family History  Problem Relation Age of Onset  . Diabetes Mother   . Hypertension Mother   . Kidney failure Mother   . Diabetes Father   . Hypertension Father   . Heart disease Father   . Diabetes Other   . Hypertension Other   . Prostate cancer Other   . Sarcoidosis Other        pulmonary, sister  . Diabetes Brother   . Hypertension Brother      Current Outpatient Medications:  .  amLODipine-valsartan (EXFORGE) 5-160 MG tablet, Take 1 tablet by mouth daily., Disp: 90 tablet, Rfl: 1 .  APPLE CIDER VINEGAR PO, Take by mouth., Disp: , Rfl:  .  CINNAMON PO, Take by mouth., Disp: , Rfl:  .  metFORMIN (GLUCOPHAGE) 500 MG tablet, One tab po daily x 7 days, then twice daily, Disp: 60 tablet, Rfl: 11 .  Omega-3 Fatty Acids (FISH OIL) 1000 MG CAPS, Take by mouth., Disp: , Rfl:    Allergies  Allergen Reactions  . Aspirin Swelling     Review of Systems  Constitutional: Negative.   Respiratory: Negative.   Cardiovascular: Negative.  Negative for chest pain.  Gastrointestinal: Negative.   Endocrine: Negative for polydipsia, polyphagia and polyuria.  Neurological: Negative.   Psychiatric/Behavioral: Negative for confusion.     Today's Vitals  01/07/21 1103  BP: 130/78  Pulse: 81  Temp: 98.3 F (36.8 C)  TempSrc: Oral  Weight: 210 lb (95.3 kg)  Height: 5\' 11"  (1.803 m)   Body mass index is 29.29 kg/m.   Objective:  Physical Exam Constitutional:      Appearance: Normal appearance. He is normal weight.  HENT:     Head: Normocephalic and atraumatic.  Cardiovascular:     Rate and Rhythm: Normal rate and regular rhythm.     Pulses: Normal pulses.     Heart sounds: Normal heart sounds.  Pulmonary:     Effort: Pulmonary effort is normal. No respiratory distress.     Breath sounds: Normal breath sounds. No wheezing.   Skin:    General: Skin is warm and dry.     Capillary Refill: Capillary refill takes less than 2 seconds.  Neurological:     Mental Status: He is alert and oriented to person, place, and time.  Psychiatric:        Mood and Affect: Mood normal.        Behavior: Behavior normal.        Thought Content: Thought content normal.         Assessment And Plan:     1. Uncontrolled type 2 diabetes mellitus with hyperglycemia (HCC) -Patient did not want to start insulin last time he was here with Hgb A1c of 12.1. He is back today for another Hgb A1c. Will check UA today for glucose and Hgb A1c then consider adding adding GLP1 or insulin to treatment.  -Educated patient about continuing to eat healthy with a diabetic diet consisting of low carbs, low sugary foods and drinks, adding greens and vegetables.  -Increase physical activity with exercise for atleast 150 min weekly.  - Hemoglobin A1c - POCT Urinalysis Dipstick )  Patient was given opportunity to ask questions. Patient verbalized understanding of the plan and was able to repeat key elements of the plan. All questions were answered to their satisfaction.  ([id number], NP   I, Charlesetta Ivory, NP, have reviewed all documentation for this visit. The documentation on 01/07/21 for the exam, diagnosis, procedures, and orders are all accurate and complete.  THE PATIENT IS ENCOURAGED TO PRACTICE SOCIAL DISTANCING DUE TO THE COVID-19 PANDEMIC.

## 2021-01-08 LAB — HEMOGLOBIN A1C
Est. average glucose Bld gHb Est-mCnc: 128 mg/dL
Hgb A1c MFr Bld: 6.1 % — ABNORMAL HIGH (ref 4.8–5.6)

## 2021-05-13 ENCOUNTER — Ambulatory Visit: Payer: Managed Care, Other (non HMO) | Admitting: Internal Medicine

## 2021-05-15 ENCOUNTER — Other Ambulatory Visit: Payer: Self-pay | Admitting: Internal Medicine

## 2021-08-04 ENCOUNTER — Other Ambulatory Visit: Payer: Self-pay | Admitting: Nurse Practitioner

## 2021-10-23 ENCOUNTER — Other Ambulatory Visit: Payer: Self-pay | Admitting: Nurse Practitioner

## 2021-11-02 ENCOUNTER — Other Ambulatory Visit: Payer: Self-pay | Admitting: Internal Medicine

## 2021-11-25 ENCOUNTER — Encounter: Payer: Managed Care, Other (non HMO) | Admitting: Internal Medicine

## 2022-06-01 ENCOUNTER — Ambulatory Visit: Payer: Managed Care, Other (non HMO) | Admitting: Nurse Practitioner

## 2022-11-12 ENCOUNTER — Other Ambulatory Visit: Payer: Self-pay | Admitting: Internal Medicine

## 2023-02-09 ENCOUNTER — Ambulatory Visit: Payer: Self-pay | Admitting: Family Medicine

## 2023-02-25 ENCOUNTER — Ambulatory Visit: Payer: Self-pay | Admitting: Family Medicine

## 2023-04-26 ENCOUNTER — Ambulatory Visit: Payer: Self-pay | Admitting: Family Medicine
